# Patient Record
Sex: Female | Born: 1985
Health system: Southern US, Community
[De-identification: ages and names within clinical notes are randomized; demographics above are authoritative.]

## PROBLEM LIST (undated history)

## (undated) DIAGNOSIS — R87629 Unspecified abnormal cytological findings in specimens from vagina: Secondary | ICD-10-CM

## (undated) DIAGNOSIS — I10 Essential (primary) hypertension: Secondary | ICD-10-CM

## (undated) DIAGNOSIS — I82409 Acute embolism and thrombosis of unspecified deep veins of unspecified lower extremity: Secondary | ICD-10-CM

## (undated) DIAGNOSIS — A64 Unspecified sexually transmitted disease: Secondary | ICD-10-CM

## (undated) DIAGNOSIS — E039 Hypothyroidism, unspecified: Secondary | ICD-10-CM

## (undated) DIAGNOSIS — O24419 Gestational diabetes mellitus in pregnancy, unspecified control: Secondary | ICD-10-CM

## (undated) DIAGNOSIS — E119 Type 2 diabetes mellitus without complications: Secondary | ICD-10-CM

## (undated) DIAGNOSIS — Z8619 Personal history of other infectious and parasitic diseases: Secondary | ICD-10-CM

## (undated) HISTORY — DX: Essential (primary) hypertension: I10

## (undated) HISTORY — PX: EXPLORATORY LAPAROTOMY: SUR591

## (undated) HISTORY — PX: WISDOM TOOTH EXTRACTION: SHX21

## (undated) HISTORY — DX: Gestational diabetes mellitus in pregnancy, unspecified control: O24.419

## (undated) HISTORY — DX: Personal history of other infectious and parasitic diseases: Z86.19

## (undated) HISTORY — DX: Unspecified abnormal cytological findings in specimens from vagina: R87.629

## (undated) HISTORY — DX: Hypothyroidism, unspecified: E03.9

## (undated) HISTORY — DX: Unspecified sexually transmitted disease: A64

## (undated) HISTORY — DX: Acute embolism and thrombosis of unspecified deep veins of unspecified lower extremity: I82.409

## (undated) HISTORY — PX: OVARIAN CYST SURGERY: SHX726

## (undated) HISTORY — DX: Type 2 diabetes mellitus without complications: E11.9

---

## 2011-06-21 ENCOUNTER — Ambulatory Visit (INDEPENDENT_AMBULATORY_CARE_PROVIDER_SITE_OTHER): Payer: 59 | Admitting: Gynecology

## 2011-06-21 DIAGNOSIS — Z23 Encounter for immunization: Secondary | ICD-10-CM

## 2011-06-21 DIAGNOSIS — Z3049 Encounter for surveillance of other contraceptives: Secondary | ICD-10-CM

## 2011-06-27 ENCOUNTER — Encounter: Payer: Self-pay | Admitting: Anesthesiology

## 2011-07-05 ENCOUNTER — Ambulatory Visit (INDEPENDENT_AMBULATORY_CARE_PROVIDER_SITE_OTHER): Payer: 59 | Admitting: Gynecology

## 2011-07-05 DIAGNOSIS — Z3049 Encounter for surveillance of other contraceptives: Secondary | ICD-10-CM

## 2011-07-05 DIAGNOSIS — Z3046 Encounter for surveillance of implantable subdermal contraceptive: Secondary | ICD-10-CM

## 2011-09-06 ENCOUNTER — Ambulatory Visit (INDEPENDENT_AMBULATORY_CARE_PROVIDER_SITE_OTHER): Payer: 59 | Admitting: Gynecology

## 2011-09-06 ENCOUNTER — Other Ambulatory Visit (HOSPITAL_COMMUNITY)
Admission: RE | Admit: 2011-09-06 | Discharge: 2011-09-06 | Disposition: A | Discharge status: Home / Self Care | Payer: 59 | Source: Ambulatory Visit | Attending: Gynecology | Admitting: Gynecology

## 2011-09-06 ENCOUNTER — Encounter: Payer: Self-pay | Admitting: Gynecology

## 2011-09-06 VITALS — BP 130/70 | Ht 61.75 in | Wt 222.0 lb

## 2011-09-06 DIAGNOSIS — L0390 Cellulitis, unspecified: Secondary | ICD-10-CM | POA: Insufficient documentation

## 2011-09-06 DIAGNOSIS — L0291 Cutaneous abscess, unspecified: Secondary | ICD-10-CM | POA: Insufficient documentation

## 2011-09-06 DIAGNOSIS — Z113 Encounter for screening for infections with a predominantly sexual mode of transmission: Secondary | ICD-10-CM

## 2011-09-06 DIAGNOSIS — Z01419 Encounter for gynecological examination (general) (routine) without abnormal findings: Secondary | ICD-10-CM | POA: Insufficient documentation

## 2011-09-06 DIAGNOSIS — A5400 Gonococcal infection of lower genitourinary tract, unspecified: Secondary | ICD-10-CM

## 2011-09-06 DIAGNOSIS — B373 Candidiasis of vulva and vagina: Secondary | ICD-10-CM

## 2011-09-06 DIAGNOSIS — A749 Chlamydial infection, unspecified: Secondary | ICD-10-CM

## 2011-09-06 DIAGNOSIS — B3731 Acute candidiasis of vulva and vagina: Secondary | ICD-10-CM

## 2011-09-06 DIAGNOSIS — A549 Gonococcal infection, unspecified: Secondary | ICD-10-CM

## 2011-09-06 DIAGNOSIS — I82409 Acute embolism and thrombosis of unspecified deep veins of unspecified lower extremity: Secondary | ICD-10-CM

## 2011-09-06 DIAGNOSIS — A7489 Other chlamydial diseases: Secondary | ICD-10-CM

## 2011-09-06 MED ORDER — FLUCONAZOLE 150 MG PO TABS: 150.0000 mg | ORAL_TABLET | Freq: Once | ORAL | Status: AC

## 2011-09-06 NOTE — Progress Notes (Signed)
Kathryn Buck 1986-05-08 [id number]   History:    25 y.o.  for annual exam with no complaints. Patient did want to have an STD screen she has had new partner in the past year. Review of her record and indicated in the past she's had Chlamydia and gonorrhea and treated accordingly. 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 She is currently on Depo-Provera injectable contraception 150 mg every 3 months her last injection was 07/05/2011. Her record and indicated she was weighing 2 24,000-22. She's been followed by her family doctor in [place] a Dr. Abigail Butts who has been treating her for hypertension and she saw her last week and had all her lab work drawn. She was told that her hemoglobin A1c was borderline and she's working on exercise and diet.  Past medical history,surgical history, family history and social history were all reviewed and documented in the EPIC chart.  ROS:  Was performed and pertinent positives and negatives are included in the history.  Exam: chaperone present BP 130/70  Ht 5' 1.75" (1.568 m)  Wt 222 lb (100.699 kg)  BMI 40.93 kg/m2  LMP 07/06/2011  Body mass index is 40.93 kg/(m^2).  General appearance : Well developed well nourished female. No acute distress HEENT: Neck supple, trachea midline, no carotid bruits, no thyroidmegaly Lungs: Clear to auscultation, no rhonchi or wheezes, or rib retractions  Heart: Regular rate and rhythm, no murmurs or gallops Breast:Examined in sitting and supine position were symmetrical in appearance, no palpable masses or tenderness,  no skin retraction, no nipple inversion, no nipple discharge, no skin discoloration, no axillary or supraclavicular lymphadenopathy Abdomen: no palpable masses or tenderness, no rebound or guarding Extremities: no edema or skin discoloration or tenderness  Pelvic:  Bartholin, Urethra, Skene Glands: Within normal limits             Vagina: No gross lesions or  discharge  Cervix: No gross lesions or discharge  Uterus  anteverted, normal size, shape and consistency, non-tender and mobile  Adnexa  Without masses or tenderness  Anus and perineum  normal   Rectovaginal  normal sphincter tone without palpated masses or tenderness             Hemoccult not done      Assessment/Plan:  25 y.o. female for annual exam unremarkable. STD screen was done: Wet prep demonstrated moniliasis prescription for Diflucan 150 mg was provided. GC and Chlamydia culture, HIV, RPR, hepatitis B surface antigen done result pending at time of this dictation. Pap smear done today. Patient was encouraged to continue her monthly self breast examination and to follow with her internist. [name] see her back in one year or when necessary.    Ok Edwards MD, 10:44 AM 09/06/2011

## 2011-09-11 ENCOUNTER — Encounter: Payer: 59 | Admitting: Gynecology

## 2011-09-26 ENCOUNTER — Ambulatory Visit (INDEPENDENT_AMBULATORY_CARE_PROVIDER_SITE_OTHER): Payer: 59

## 2011-09-26 DIAGNOSIS — Z23 Encounter for immunization: Secondary | ICD-10-CM

## 2011-09-26 DIAGNOSIS — Z309 Encounter for contraceptive management, unspecified: Secondary | ICD-10-CM

## 2011-09-26 MED ORDER — MEDROXYPROGESTERONE ACETATE 150 MG/ML IM SUSP
150.0000 mg | Freq: Once | INTRAMUSCULAR | Status: AC
  Administered 2011-09-26: 150 mg via INTRAMUSCULAR

## 2011-12-19 ENCOUNTER — Ambulatory Visit (INDEPENDENT_AMBULATORY_CARE_PROVIDER_SITE_OTHER): Payer: 59 | Admitting: Anesthesiology

## 2011-12-19 DIAGNOSIS — Z309 Encounter for contraceptive management, unspecified: Secondary | ICD-10-CM

## 2011-12-19 MED ORDER — MEDROXYPROGESTERONE ACETATE 150 MG/ML IM SUSP
150.0000 mg | Freq: Once | INTRAMUSCULAR | Status: AC
  Administered 2011-12-19: 150 mg via INTRAMUSCULAR

## 2012-03-12 ENCOUNTER — Ambulatory Visit (INDEPENDENT_AMBULATORY_CARE_PROVIDER_SITE_OTHER): Payer: 59 | Admitting: Anesthesiology

## 2012-03-12 DIAGNOSIS — Z23 Encounter for immunization: Secondary | ICD-10-CM

## 2012-03-12 DIAGNOSIS — Z309 Encounter for contraceptive management, unspecified: Secondary | ICD-10-CM

## 2012-03-12 MED ORDER — MEDROXYPROGESTERONE ACETATE 150 MG/ML IM SUSP
150.0000 mg | Freq: Once | INTRAMUSCULAR | Status: AC
  Administered 2012-03-12: 150 mg via INTRAMUSCULAR

## 2012-03-25 ENCOUNTER — Encounter (HOSPITAL_COMMUNITY): Payer: Self-pay | Admitting: *Deleted

## 2012-03-25 ENCOUNTER — Emergency Department (INDEPENDENT_AMBULATORY_CARE_PROVIDER_SITE_OTHER)
Admission: EM | Admit: 2012-03-25 | Discharge: 2012-03-25 | Disposition: A | Discharge status: Home / Self Care | Payer: 59 | Source: Home / Self Care | Attending: Family Medicine | Admitting: Family Medicine

## 2012-03-25 DIAGNOSIS — N390 Urinary tract infection, site not specified: Secondary | ICD-10-CM

## 2012-03-25 LAB — POCT URINALYSIS DIP (DEVICE)
Bilirubin Urine: NEGATIVE
Glucose, UA: 100 mg/dL — AB
Ketones, ur: NEGATIVE mg/dL

## 2012-03-25 MED ORDER — SULFAMETHOXAZOLE-TRIMETHOPRIM 800-160 MG PO TABS: 1.0000 | ORAL_TABLET | Freq: Two times a day (BID) | ORAL | Status: AC

## 2012-03-25 MED ORDER — FLUCONAZOLE 150 MG PO TABS: 150.0000 mg | ORAL_TABLET | Freq: Once | ORAL | Status: AC

## 2012-03-25 NOTE — Discharge Instructions (Signed)
Take antibiotics as directed. Fill the fluconazole rx only if symptoms develop. Return to care should your symptoms not improve, or worsen in any way.

## 2012-03-25 NOTE — ED Provider Notes (Signed)
History     CSN: [id number]  Arrival date & time 03/25/12  0845   First MD Initiated Contact with Patient 03/25/12 [phone]      Chief Complaint  Patient presents with  . Urinary Tract Infection    (Consider location/radiation/quality/duration/timing/severity/associated sxs/prior treatment) HPI Comments: Kathryn Buck resents for evaluation of urinary frequency, urgency, decreased output, and terminal dysuria. She reports onset of symptoms 2 weeks ago, but they mildly improved. She reports worsening of symptoms last night. She reports that this is consistent with previous episodes of urinary tract infections. She denies any fever, nausea, vomiting, or back pain. She denies any vaginal discharge or bleeding.  Patient is a 26 y.o. female presenting with dysuria. The history is provided by the patient.  Dysuria  This is a new problem. The problem occurs every urination. The problem has been gradually worsening. The quality of the pain is described as burning. The pain is mild. There has been no fever. Associated symptoms include frequency and urgency. Pertinent negatives include no chills, no sweats, no nausea, no vomiting, no discharge, no hematuria, no hesitancy and no flank pain.    Past Medical History  Diagnosis Date  . History of chlamydia   . History of gonorrhea   . Hypertension   . DVT (deep venous thrombosis)     ON Sheryle Spray    Past Surgical History  Procedure Date  . Wisdom tooth extraction     Family History  Problem Relation Age of Onset  . Diabetes Mother   . Heart disease Mother   . Hypertension Father   . Diabetes Sister     History  Substance Use Topics  . Smoking status: Never Smoker   . Smokeless tobacco: Never Used  . Alcohol Use: Yes     occasional    OB History    Grav Para Term Preterm Abortions TAB SAB Ect Mult Living   0 0              Review of Systems  Constitutional: Negative.  Negative for chills.  HENT: Negative.   Eyes: Negative.     Respiratory: Negative.   Cardiovascular: Negative.   Gastrointestinal: Negative.  Negative for nausea and vomiting.  Genitourinary: Positive for dysuria, urgency, frequency and decreased urine volume. Negative for hesitancy, hematuria, flank pain, vaginal bleeding, vaginal discharge and difficulty urinating.  Musculoskeletal: Negative.   Skin: Negative.   Neurological: Negative.     Allergies  Review of patient's allergies indicates no known allergies.  Home Medications   Current Outpatient Rx  Name Route Sig Dispense Refill  . FLUCONAZOLE 150 MG PO TABS Oral Take 1 tablet (150 mg total) by mouth once. Take one pill once. May repeat if symptoms persist after 3rd day. 2 tablet 0  . MEDROXYPROGESTERONE ACETATE 150 MG/ML IM SUSP Intramuscular Inject 150 mg into the muscle every 3 (three) months.      . SULFAMETHOXAZOLE-TRIMETHOPRIM 800-160 MG PO TABS Oral Take 1 tablet by mouth 2 (two) times daily. 14 tablet 0    BP 133/87  Pulse 108  Temp(Src) 98 F (36.7 C) (Oral)  Resp 12  SpO2 97%  Physical Exam  Nursing note and vitals reviewed. Constitutional: She is oriented to person, place, and time. She appears well-developed and well-nourished.  HENT:  Head: Normocephalic and atraumatic.  Eyes: EOM are normal.  Neck: Normal range of motion.  Pulmonary/Chest: Effort normal.  Musculoskeletal: Normal range of motion.  Neurological: She is alert and oriented to person, place,  and time.  Skin: Skin is warm and dry.  Psychiatric: Her behavior is normal.    ED Course  Procedures (including critical care time)  Labs Reviewed  POCT URINALYSIS DIP (DEVICE) - Abnormal; Notable for the following:    Glucose, UA 100 (*)    Hgb urine dipstick TRACE (*)    Protein, ur 30 (*)    Leukocytes, UA TRACE (*) Biochemical Testing Only. Please order routine urinalysis from main lab if confirmatory testing is needed.   All other components within normal limits  POCT PREGNANCY, URINE   No  results found.   1. UTI (lower urinary tract infection)       MDM  Labs reviewed; given rx for TMP-SMX, fluconazole if needed        Renaee Munda, MD 03/25/12 [phone]

## 2012-03-25 NOTE — ED Notes (Signed)
Urinary frequency and some dysuria the past 2 weeks.  Denies fever at home.

## 2012-06-03 ENCOUNTER — Ambulatory Visit (INDEPENDENT_AMBULATORY_CARE_PROVIDER_SITE_OTHER): Payer: 59 | Admitting: Anesthesiology

## 2012-06-03 DIAGNOSIS — IMO0001 Reserved for inherently not codable concepts without codable children: Secondary | ICD-10-CM

## 2012-06-03 DIAGNOSIS — Z309 Encounter for contraceptive management, unspecified: Secondary | ICD-10-CM

## 2012-06-03 MED ORDER — MEDROXYPROGESTERONE ACETATE 150 MG/ML IM SUSP
150.0000 mg | Freq: Once | INTRAMUSCULAR | Status: AC
  Administered 2012-06-03: 150 mg via INTRAMUSCULAR

## 2012-08-19 ENCOUNTER — Other Ambulatory Visit: Payer: Self-pay | Admitting: *Deleted

## 2012-08-19 MED ORDER — MEDROXYPROGESTERONE ACETATE 150 MG/ML IM SUSP: 150.0000 mg | INTRAMUSCULAR | Status: DC

## 2012-08-19 NOTE — Telephone Encounter (Signed)
Pt called requesting refill on depo provera shot, annual scheduled on 09/15/12. rx sent.

## 2012-08-27 ENCOUNTER — Ambulatory Visit (INDEPENDENT_AMBULATORY_CARE_PROVIDER_SITE_OTHER): Payer: 59 | Admitting: *Deleted

## 2012-08-27 DIAGNOSIS — Z3049 Encounter for surveillance of other contraceptives: Secondary | ICD-10-CM

## 2012-08-27 MED ORDER — MEDROXYPROGESTERONE ACETATE 150 MG/ML IM SUSP
150.0000 mg | Freq: Once | INTRAMUSCULAR | Status: AC
  Administered 2012-08-27: 150 mg via INTRAMUSCULAR

## 2012-09-15 ENCOUNTER — Ambulatory Visit (INDEPENDENT_AMBULATORY_CARE_PROVIDER_SITE_OTHER): Payer: 59 | Admitting: Women's Health

## 2012-09-15 ENCOUNTER — Encounter: Payer: Self-pay | Admitting: Women's Health

## 2012-09-15 VITALS — BP 130/72 | Ht 62.0 in | Wt 224.0 lb

## 2012-09-15 DIAGNOSIS — Z309 Encounter for contraceptive management, unspecified: Secondary | ICD-10-CM

## 2012-09-15 DIAGNOSIS — IMO0001 Reserved for inherently not codable concepts without codable children: Secondary | ICD-10-CM

## 2012-09-15 DIAGNOSIS — E669 Obesity, unspecified: Secondary | ICD-10-CM

## 2012-09-15 DIAGNOSIS — Z113 Encounter for screening for infections with a predominantly sexual mode of transmission: Secondary | ICD-10-CM

## 2012-09-15 DIAGNOSIS — Z01419 Encounter for gynecological examination (general) (routine) without abnormal findings: Secondary | ICD-10-CM

## 2012-09-15 DIAGNOSIS — Z833 Family history of diabetes mellitus: Secondary | ICD-10-CM

## 2012-09-15 LAB — WET PREP FOR TRICH, YEAST, CLUE
Trich, Wet Prep: NONE SEEN
Yeast Wet Prep HPF POC: NONE SEEN

## 2012-09-15 MED ORDER — MEDROXYPROGESTERONE ACETATE 150 MG/ML IM SUSP: 150.0000 mg | INTRAMUSCULAR | Status: DC

## 2012-09-15 NOTE — Progress Notes (Signed)
Kathryn Buck 1986/03/26 [id number]    History:    The patient presents for annual exam.  Rare bleeding on Depo-Provera 150 every 12 weeks. History of a DVT while using Ortho Evra patch. History of positive gonorrhea/ Chlamydia. Completed gardasil 2012. Normal Pap 2012. Had Implanon in the past removed 2012. Had been treated with HCTZ for hypertension, primary care stopped medication, BP stable.   Past medical history, past surgical history, family history and social history were all reviewed and documented in the EPIC chart. 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   ROS:  A  ROS was performed and pertinent positives and negatives are included in the history.  Exam:  Filed Vitals:   09/15/12 1402  BP: 130/72    General appearance:  Normal Head/Neck:  Normal, without cervical or supraclavicular adenopathy. Thyroid:  Symmetrical, normal in size, without palpable masses or nodularity. Respiratory  Effort:  Normal  Auscultation:  Clear without wheezing or rhonchi Cardiovascular  Auscultation:  Regular rate, without rubs, murmurs or gallops  Edema/varicosities:  Not grossly evident Abdominal  Soft,nontender, without masses, guarding or rebound.  Liver/spleen:  No organomegaly noted  Hernia:  None appreciated  Skin  Inspection:  Grossly normal  Palpation:  Grossly normal Neurologic/psychiatric  Orientation:  Normal with appropriate conversation.  Mood/affect:  Normal  Genitourinary    Breasts: Examined lying and sitting.     Right: Without masses, retractions, discharge or axillary adenopathy.     Left: Without masses, retractions, discharge or axillary adenopathy.   Inguinal/mons:  Normal without inguinal adenopathy  External genitalia:  Normal  BUS/Urethra/Skene's glands:  Normal  Bladder:  Normal  Vagina:  Normal wet prep negative  Cervix:  Normal  Uterus: normal in size, shape and contour.  Midline and mobile  Adnexa/parametria:     Rt: Without masses or  tenderness.   Lt: Without masses or tenderness.  Anus and perineum: Normal  Digital rectal exam: Normal sphincter tone without palpated masses or tenderness  Assessment/Plan:  26 y.o. SBF G0 for annual exam with complaint of increased discharge at times.   Normal GYN exam on Depo-Provera Obesity STD screen History of hypertension  Plan: Depo-Provera 150 IM q. 12 weeks prescription, proper use given and reviewed. SBE's, increase exercise, decrease calories, calcium rich diet, MVI daily encouraged. CBC, glucose, UA, no Pap, history of normal Paps, new screening guidelines reviewed. GC/Chlamydia, HIV, hepatitis B., C., RPR. Condoms encouraged if becomes sexually active. Reviewed normality of discharge and wet prep negative. Aware of importance of blood pressure control, will return to primary care if blood pressure greater than 130/80, which she checks at work.   Harrington Challenger [facility], 2:53 PM 09/15/2012

## 2012-09-15 NOTE — Patient Instructions (Addendum)

## 2012-09-16 LAB — URINALYSIS W MICROSCOPIC + REFLEX CULTURE
Bilirubin Urine: NEGATIVE
Crystals: NONE SEEN
Glucose, UA: NEGATIVE mg/dL
Specific Gravity, Urine: >1.03 — ABNORMAL HIGH (ref 1.005–1.030)
Squamous Epithelial / LPF: NONE SEEN
Urobilinogen, UA: 1 mg/dL (ref 0.0–1.0)

## 2012-09-16 LAB — HEPATITIS B SURFACE ANTIGEN: Hepatitis B Surface Ag: NEGATIVE

## 2012-09-16 LAB — RPR

## 2012-09-16 LAB — HEPATITIS C ANTIBODY: HCV Ab: NEGATIVE

## 2012-09-16 LAB — CBC WITH DIFFERENTIAL/PLATELET
Basophils Absolute: 0 10*3/uL (ref 0.0–0.1)
Eosinophils Absolute: 0.4 10*3/uL (ref 0.0–0.7)
Eosinophils Relative: 4 % (ref 0–5)
HCT: 45.8 % (ref 36.0–46.0)
Lymphs Abs: 3.1 10*3/uL (ref 0.7–4.0)
MCH: 27.4 pg (ref 26.0–34.0)
MCV: 84.2 fL (ref 78.0–100.0)
Monocytes Absolute: 0.6 10*3/uL (ref 0.1–1.0)
Platelets: 380 10*3/uL (ref 150–400)
RDW: 13.6 % (ref 11.5–15.5)

## 2012-09-16 LAB — HIV ANTIBODY (ROUTINE TESTING W REFLEX): HIV: NONREACTIVE

## 2012-11-25 ENCOUNTER — Ambulatory Visit (INDEPENDENT_AMBULATORY_CARE_PROVIDER_SITE_OTHER): Payer: 59 | Admitting: *Deleted

## 2012-11-25 DIAGNOSIS — Z3049 Encounter for surveillance of other contraceptives: Secondary | ICD-10-CM

## 2012-11-25 MED ORDER — MEDROXYPROGESTERONE ACETATE 150 MG/ML IM SUSP
150.0000 mg | Freq: Once | INTRAMUSCULAR | Status: AC
  Administered 2012-11-25: 150 mg via INTRAMUSCULAR

## 2013-02-13 ENCOUNTER — Ambulatory Visit (INDEPENDENT_AMBULATORY_CARE_PROVIDER_SITE_OTHER): Payer: 59 | Admitting: *Deleted

## 2013-02-13 DIAGNOSIS — Z3049 Encounter for surveillance of other contraceptives: Secondary | ICD-10-CM

## 2013-02-13 MED ORDER — MEDROXYPROGESTERONE ACETATE 150 MG/ML IM SUSP
150.0000 mg | Freq: Once | INTRAMUSCULAR | Status: AC
  Administered 2013-02-13: 150 mg via INTRAMUSCULAR

## 2013-05-07 ENCOUNTER — Ambulatory Visit (INDEPENDENT_AMBULATORY_CARE_PROVIDER_SITE_OTHER): Payer: 59 | Admitting: Gynecology

## 2013-05-07 DIAGNOSIS — Z309 Encounter for contraceptive management, unspecified: Secondary | ICD-10-CM

## 2013-05-07 MED ORDER — MEDROXYPROGESTERONE ACETATE 150 MG/ML IM SUSP
150.0000 mg | Freq: Once | INTRAMUSCULAR | Status: AC
  Administered 2013-05-07: 150 mg via INTRAMUSCULAR

## 2013-06-06 ENCOUNTER — Emergency Department (HOSPITAL_BASED_OUTPATIENT_CLINIC_OR_DEPARTMENT_OTHER)
Admission: EM | Admit: 2013-06-06 | Discharge: 2013-06-07 | Disposition: A | Discharge status: Home / Self Care | Payer: 59 | Attending: Emergency Medicine | Admitting: Emergency Medicine

## 2013-06-06 ENCOUNTER — Encounter (HOSPITAL_BASED_OUTPATIENT_CLINIC_OR_DEPARTMENT_OTHER): Payer: Self-pay | Admitting: *Deleted

## 2013-06-06 DIAGNOSIS — M545 Low back pain, unspecified: Secondary | ICD-10-CM | POA: Insufficient documentation

## 2013-06-06 DIAGNOSIS — Z86718 Personal history of other venous thrombosis and embolism: Secondary | ICD-10-CM | POA: Insufficient documentation

## 2013-06-06 DIAGNOSIS — D271 Benign neoplasm of left ovary: Secondary | ICD-10-CM

## 2013-06-06 DIAGNOSIS — Z3202 Encounter for pregnancy test, result negative: Secondary | ICD-10-CM | POA: Insufficient documentation

## 2013-06-06 DIAGNOSIS — R110 Nausea: Secondary | ICD-10-CM | POA: Insufficient documentation

## 2013-06-06 DIAGNOSIS — I10 Essential (primary) hypertension: Secondary | ICD-10-CM | POA: Insufficient documentation

## 2013-06-06 DIAGNOSIS — D279 Benign neoplasm of unspecified ovary: Secondary | ICD-10-CM | POA: Insufficient documentation

## 2013-06-06 DIAGNOSIS — Z8619 Personal history of other infectious and parasitic diseases: Secondary | ICD-10-CM | POA: Insufficient documentation

## 2013-06-06 LAB — URINALYSIS, ROUTINE W REFLEX MICROSCOPIC
Bilirubin Urine: NEGATIVE
Glucose, UA: NEGATIVE mg/dL
Hgb urine dipstick: NEGATIVE
Ketones, ur: 15 mg/dL — AB
Protein, ur: NEGATIVE mg/dL
pH: 6 (ref 5.0–8.0)

## 2013-06-06 LAB — URINE MICROSCOPIC-ADD ON

## 2013-06-06 NOTE — ED Notes (Signed)
Pt c/o left flank pain since yesterday. "Feels better after I pee." No hx of stone. +nausea. Denies other s/s.

## 2013-06-07 ENCOUNTER — Emergency Department (HOSPITAL_BASED_OUTPATIENT_CLINIC_OR_DEPARTMENT_OTHER): Payer: 59

## 2013-06-07 MED ORDER — HYDROCODONE-ACETAMINOPHEN 5-325 MG PO TABS: 1.0000 | ORAL_TABLET | Freq: Four times a day (QID) | ORAL | Status: DC | PRN

## 2013-06-07 MED ORDER — NITROFURANTOIN MONOHYD MACRO 100 MG PO CAPS
100.0000 mg | ORAL_CAPSULE | Freq: Once | ORAL | Status: AC
  Administered 2013-06-07: 100 mg via ORAL
  Filled 2013-06-07: qty 1

## 2013-06-07 MED ORDER — NITROFURANTOIN MONOHYD MACRO 100 MG PO CAPS: 100.0000 mg | ORAL_CAPSULE | Freq: Two times a day (BID) | ORAL | Status: DC

## 2013-06-07 NOTE — ED Notes (Addendum)
rx x 2 for macrobid and hydrocodone

## 2013-06-07 NOTE — ED Provider Notes (Signed)
History    This chart was scribed for Kathryn Seamen, MD by Quintella Reichert, ED scribe.  This patient was seen in room MH07/MH07 and the patient's care was started at 12:04 AM.   CSN: [id number]  Arrival date & time 06/06/13  2315      Chief Complaint  Patient presents with  . Flank Pain     The history is provided by the patient. No language interpreter was used.    HPI Comments: Kathryn Buck is a 27 y.o. female with h/o recurrent flank pain who presents to the Emergency Department complaining of intermittent left flank pain that began yesterday.  Pain is characterized as dull and localized to the left lower back and LLQ of her abdomen.  Patient notes that pain is brought on by certain positions and is also exacerbated after urinating, though she denies increased pain during urination.  She also experiences nausea when pain is very bad.  She denies dysuria, hematuria, fever, or chills.  She admits to history of similar episodes that occur every other month and last 1-2 days before resolving on their own.  She has not sought treatment for these symptoms.  She denies h/o kidney stones to her knowledge..   Past Medical History  Diagnosis Date  . History of chlamydia   . History of gonorrhea   . Hypertension   . DVT (deep venous thrombosis)     ON Sheryle Spray    Past Surgical History  Procedure Laterality Date  . Wisdom tooth extraction      Family History  Problem Relation Age of Onset  . Diabetes Mother   . Heart disease Mother   . Hypertension Mother   . Hypertension Father   . Diabetes Sister     History  Substance Use Topics  . Smoking status: Never Smoker   . Smokeless tobacco: Never Used  . Alcohol Use: Yes     Comment: occasional    OB History   Grav Para Term Preterm Abortions TAB SAB Ect Mult Living   0 0              Review of Systems A complete 10 system review of systems was obtained and all systems are negative except as noted in the HPI and  PMH.    Allergies  Review of patient's allergies indicates no known allergies.  Home Medications   Current Outpatient Rx  Name  Route  Sig  Dispense  Refill  . medroxyPROGESTERone (DEPO-PROVERA) 150 MG/ML injection   Intramuscular   Inject 1 mL (150 mg total) into the muscle every 3 (three) months.   1 mL   4     BP 152/104  Pulse 104  Temp(Src) 98.7 F (37.1 C) (Oral)  Resp 20  Ht 5\' 2"  (1.575 m)  Wt 220 lb (99.791 kg)  BMI 40.23 kg/m2  SpO2 100%  Physical Exam  Nursing note and vitals reviewed. General: Well-developed, well-nourished female in no acute distress; appearance consistent with age of record HENT: normocephalic, atraumatic Eyes: pupils equal round and reactive to light; extraocular muscles intact Neck: supple Heart: regular rate and rhythm; no murmurs, rubs or gallops Lungs: clear to auscultation bilaterally Abdomen: soft; nondistended; nontender; no masses or hepatosplenomegaly; bowel sounds present GU: No CVA tenderness Extremities: No deformity; full range of motion; pulses normal Neurologic: Awake, alert and oriented; motor function intact in all extremities and symmetric; no facial droop Skin: Warm and dry Psychiatric: Normal mood and affect   ED  Course  Procedures (including critical care time)  DIAGNOSTIC STUDIES: Oxygen Saturation is 100% on room air, normal by my interpretation.    COORDINATION OF CARE: 12:06 AM-Discussed treatment plan which includes CT-scan to rule out kidney stone with pt at bedside and pt agreed to plan.     MDM   Nursing notes and vitals signs, including pulse oximetry, reviewed.  Summary of this visit's results, reviewed by myself:  Labs:  Results for orders placed during the hospital encounter of 06/06/13 (from the past 24 hour(s))  URINALYSIS, ROUTINE W REFLEX MICROSCOPIC     Status: Abnormal   Collection Time    06/06/13 11:29 PM      Result Value Range   Color, Urine YELLOW  YELLOW   APPearance CLEAR   CLEAR   Specific Gravity, Urine 1.030  1.005 - 1.030   pH 6.0  5.0 - 8.0   Glucose, UA NEGATIVE  NEGATIVE mg/dL   Hgb urine dipstick NEGATIVE  NEGATIVE   Bilirubin Urine NEGATIVE  NEGATIVE   Ketones, ur 15 (*) NEGATIVE mg/dL   Protein, ur NEGATIVE  NEGATIVE mg/dL   Urobilinogen, UA 0.2  0.0 - 1.0 mg/dL   Nitrite NEGATIVE  NEGATIVE   Leukocytes, UA MODERATE (*) NEGATIVE  PREGNANCY, URINE     Status: None   Collection Time    06/06/13 11:29 PM      Result Value Range   Preg Test, Ur NEGATIVE  NEGATIVE  URINE MICROSCOPIC-ADD ON     Status: Abnormal   Collection Time    06/06/13 11:29 PM      Result Value Range   Squamous Epithelial / LPF FEW (*) RARE   WBC, UA 11-20  <3 WBC/hpf   RBC / HPF 0-2  <3 RBC/hpf   Bacteria, UA MANY (*) RARE    Imaging Studies: Ct Abdomen Pelvis Wo Contrast  06/07/2013   *RADIOLOGY REPORT*  Clinical Data: Left flank pain and nausea for 1 day, history hypertension  CT ABDOMEN AND PELVIS WITHOUT CONTRAST  Technique:  Multidetector CT imaging of the abdomen and pelvis was performed following the standard protocol without intravenous contrast. Sagittal and coronal MPR images reconstructed from axial data set.  Comparison: None  Findings: 4 mm nonspecific left lower lobe nodule image 7. No urinary tract calcification, hydronephrosis or ureteral dilatation. Within limits of a nonenhanced exam no focal abnormalities of the liver, spleen, pancreas, kidneys, or adrenal glands. Normal-appearing decompressed bladder, ureters, uterus, and right ovary. Mass identified arising from left ovary, 8.4 x 7.1 x 9.2 cm in size, containing fat, soft tissue, and calcific components/teeth, consistent with a large dermoid tumor. Stomach and bowel loops normal appearance. No additional mass, adenopathy, free fluid, hernia, or bony lesion.  IMPRESSION: No urinary tract calcification or obstruction. 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   Original Report Authenticated  By: Ulyses Southward, M.D.   12:44 AM Patient advised of CT findings showing likely dermoid cyst. She is a patient [facility] and will call the office Monday for followup.       I personally performed the services described in this documentation, which was scribed in my presence.  The recorded information has been reviewed and is accurate.    Kathryn Seamen, MD 06/07/13 [phone]

## 2013-06-08 LAB — URINE CULTURE

## 2013-06-09 ENCOUNTER — Ambulatory Visit (INDEPENDENT_AMBULATORY_CARE_PROVIDER_SITE_OTHER): Payer: 59

## 2013-06-09 ENCOUNTER — Other Ambulatory Visit: Payer: Self-pay | Admitting: Gynecology

## 2013-06-09 ENCOUNTER — Telehealth: Payer: Self-pay

## 2013-06-09 ENCOUNTER — Encounter: Payer: Self-pay | Admitting: Gynecology

## 2013-06-09 ENCOUNTER — Ambulatory Visit (INDEPENDENT_AMBULATORY_CARE_PROVIDER_SITE_OTHER): Payer: 59 | Admitting: Gynecology

## 2013-06-09 VITALS — BP 130/88

## 2013-06-09 DIAGNOSIS — R1904 Left lower quadrant abdominal swelling, mass and lump: Secondary | ICD-10-CM

## 2013-06-09 DIAGNOSIS — R1900 Intra-abdominal and pelvic swelling, mass and lump, unspecified site: Secondary | ICD-10-CM

## 2013-06-09 DIAGNOSIS — R102 Pelvic and perineal pain: Secondary | ICD-10-CM

## 2013-06-09 DIAGNOSIS — N839 Noninflammatory disorder of ovary, fallopian tube and broad ligament, unspecified: Secondary | ICD-10-CM

## 2013-06-09 DIAGNOSIS — N838 Other noninflammatory disorders of ovary, fallopian tube and broad ligament: Secondary | ICD-10-CM

## 2013-06-09 DIAGNOSIS — E282 Polycystic ovarian syndrome: Secondary | ICD-10-CM

## 2013-06-09 DIAGNOSIS — N949 Unspecified condition associated with female genital organs and menstrual cycle: Secondary | ICD-10-CM

## 2013-06-09 NOTE — Progress Notes (Signed)
Kathryn Buck is an 27 y.o. female. who presented to the emergency room this past weekend complaining of left lower abdominal discomfort and left flank pain. Patient stated that the pain was dull and localized. Patient notes that pain is brought on by certain positions and is also exacerbated after urinating, though she denies increased pain during urination. She also experiences nausea when pain is very bad. She denies dysuria, hematuria, fever, or chills. She admits to history of similar episodes that occur every other month and last 1-2 days before resolving on their own. She has not sought treatment for these symptoms. She denies h/o kidney stones to her knowledge.  Patient had a CT ordered in the emergency room this weekend with the following results:  Normal-appearing decompressed bladder, ureters, uterus, and right  ovary.  Mass identified arising from left ovary, 8.4 x 7.1 x 9.2 cm in  size, containing fat, soft tissue, and calcific components/teeth,  consistent with a large dermoid tumor.  Stomach and bowel loops normal appearance.  No additional mass, adenopathy, free fluid, hernia, or bony lesion.   Ultrasound today: Uterus measured 8.5 x 5.7 x 4.1 cm with endometrial stripe of 4.8 mm. Right ovary with numerous follicles in the perimeter consistent with polycystic ovarian syndrome. Left ovarian tissue not seen. Left adnexal solid cystic mass with calcifications measuring 9.2 x 10.0 x 6.5 cm with arterial blood flow seen the wall of the mass. There was no fluid in the cul-de-sac.   Pertinent Gynecological History: Menses: No menses on Depo-Provera injection Bleeding: none Contraception: Depo-Provera injections DES exposure: denies Blood transfusions: none Sexually transmitted diseases: gonorrhea and chlamydia in the past Previous GYN Procedures: Anticoagulated after developing DVT on Ortho Evra contraceptive patch  Last mammogram: None indicated Date: none indicated Last pap: normal  Date: 2012 OB History: G0, P0   Menstrual History: Menarche age: 4  No LMP recorded. Patient has had an injection.    Past Medical History  Diagnosis Date  . History of chlamydia   . History of gonorrhea   . Hypertension   . DVT (deep venous thrombosis)     ON Sheryle Spray    Past Surgical History  Procedure Laterality Date  . Wisdom tooth extraction      Family History  Problem Relation Age of Onset  . Diabetes Mother   . Heart disease Mother   . Hypertension Mother   . Hypertension Father   . Diabetes Sister     Social History:  reports that she has never smoked. She has never used smokeless tobacco. She reports that  drinks alcohol. She reports that she does not use illicit drugs.  Allergies: No Known Allergies   (Not in a hospital admission)  REVIEW OF SYSTEMS: A ROS was performed and pertinent positives and negatives are included in the history.  GENERAL: No fevers or chills. HEENT: No change in vision, no earache, sore throat or sinus congestion. NECK: No pain or stiffness. CARDIOVASCULAR: No chest pain or pressure. No palpitations. PULMONARY: No shortness of breath, cough or wheeze. GASTROINTESTINAL: No abdominal pain, nausea, vomiting or diarrhea, melena or bright red blood per rectum. GENITOURINARY: No urinary frequency, urgency, hesitancy or dysuria. MUSCULOSKELETAL: No joint or muscle pain, no back pain, no recent trauma. DERMATOLOGIC: No rash, no itching, no lesions. ENDOCRINE: No polyuria, polydipsia, no heat or cold intolerance. No recent change in weight. HEMATOLOGICAL: No anemia or easy bruising or bleeding. NEUROLOGIC: No headache, seizures, numbness, tingling or weakness. PSYCHIATRIC: No depression, no loss  of interest in normal activity or change in sleep pattern.     Blood pressure 130/88.  Physical Exam:  HEENT:unremarkable Neck:Supple, midline, no thyroid megaly, no carotid bruits Lungs:  Clear to auscultation no rhonchi's or  wheezes Heart:Regular rate and rhythm, no murmurs or gallops Breast Exam:not examined Abdomen:pendulous slightly tender left lower quadrant Pelvic: Within normal limits Vagina:no lesions or discharge Cervix:no lesions or discharge Uterus:anteverted nontender Adnexa:fullness left adnexa Extremities: No cords, no edema Rectal:not examined   Assessment/Plan: 27 year old with apparent large left dermoid cyst. Patient will be scheduled for laparotomy with possible left ovarian cystectomy, possible left oophorectomy, possible left salpingo-oophorectomy. Because of patient's past history of a DVT she will be placed on Lovenox 40 mg subcutaneous 2 hours prior to surgery and will continue to do so for 4 weeks postop. Additional risks discussed with the patient were as follows:                        Patient was counseled as to the risk of surgery to include the following:  1. Infection (prohylactic antibiotics will be administered)  2. DVT/Pulmonary Embolism (prophylactic pneumo compression stockings will be used)  3.Trauma to internal organs requiring additional surgical procedure to repair any injury to     Internal organs requiring perhaps additional hospitalization days.  4.Hemmorhage requiring transfusion and blood products which carry risks such as  anaphylactic reaction, hepatitis and AIDS  Patient had received literature information on the procedure scheduled and all her questions were answered and fully accepts all risk.  [facility] HMD10:18 AMTD@  Reynaldo Minium H 06/09/2013, 10:11 AM

## 2013-06-09 NOTE — Telephone Encounter (Signed)
I called patient to schedule Expl Laparotomy per Dr Glenetta Hew surgery request.  We discussed insurance benefits and upfront costs, even discussing, patient only paying half of her upfront and making payments on the rest.  We discussed estimated hospital charges and patient's estimated financial responsibility so patient got a good idea about the costs, etc.  Patient was overwhelmed regarding the costs of surgery both hospital and physician charges.  She is not sure this is something she wants to proceed with at this time.  She said she is about to move into a new position at work in the next few weeks as well and doesn't think timing is good.   She wants time to consider all of this. She was provided my direct phone number and said she will be in touch at some point.

## 2013-07-01 ENCOUNTER — Other Ambulatory Visit: Payer: Self-pay | Admitting: Gynecology

## 2013-07-01 MED ORDER — ENOXAPARIN SODIUM 40 MG/0.4ML ~~LOC~~ SOLN: SUBCUTANEOUS | Status: DC

## 2013-07-30 ENCOUNTER — Encounter (HOSPITAL_COMMUNITY): Payer: Self-pay | Admitting: Pharmacy Technician

## 2013-07-31 ENCOUNTER — Ambulatory Visit (INDEPENDENT_AMBULATORY_CARE_PROVIDER_SITE_OTHER): Payer: 59 | Admitting: *Deleted

## 2013-07-31 DIAGNOSIS — Z3049 Encounter for surveillance of other contraceptives: Secondary | ICD-10-CM

## 2013-07-31 MED ORDER — MEDROXYPROGESTERONE ACETATE 150 MG/ML IM SUSP
150.0000 mg | Freq: Once | INTRAMUSCULAR | Status: AC
  Administered 2013-07-31: 150 mg via INTRAMUSCULAR

## 2013-08-06 ENCOUNTER — Telehealth: Payer: Self-pay

## 2013-08-06 ENCOUNTER — Encounter (HOSPITAL_COMMUNITY)
Admission: RE | Admit: 2013-08-06 | Discharge: 2013-08-06 | Disposition: A | Discharge status: Home / Self Care | Payer: 59 | Source: Ambulatory Visit | Attending: Gynecology | Admitting: Gynecology

## 2013-08-06 ENCOUNTER — Encounter (HOSPITAL_COMMUNITY): Payer: Self-pay

## 2013-08-06 DIAGNOSIS — Z01818 Encounter for other preprocedural examination: Secondary | ICD-10-CM | POA: Insufficient documentation

## 2013-08-06 DIAGNOSIS — Z01812 Encounter for preprocedural laboratory examination: Secondary | ICD-10-CM | POA: Insufficient documentation

## 2013-08-06 LAB — CBC
HCT: 42.6 % (ref 36.0–46.0)
MCV: 82.4 fL (ref 78.0–100.0)
Platelets: 373 10*3/uL (ref 150–400)
RBC: 5.17 MIL/uL — ABNORMAL HIGH (ref 3.87–5.11)
RDW: 13.2 % (ref 11.5–15.5)
WBC: 11.1 10*3/uL — ABNORMAL HIGH (ref 4.0–10.5)

## 2013-08-06 LAB — URINALYSIS, ROUTINE W REFLEX MICROSCOPIC
Hgb urine dipstick: NEGATIVE
Nitrite: NEGATIVE
Specific Gravity, Urine: >1.03 — ABNORMAL HIGH (ref 1.005–1.030)
Urobilinogen, UA: 0.2 mg/dL (ref 0.0–1.0)
pH: 6 (ref 5.0–8.0)

## 2013-08-06 NOTE — Telephone Encounter (Signed)
Kathryn Buck [phone] ext [phone]) called to let me know that prior authorization is complete and authorization is good for 2 days. [facility] will need to update on 08/12/13.  Chartered loss adjuster. #[id number].

## 2013-08-06 NOTE — Patient Instructions (Signed)
Your procedure is scheduled on:08/10/13  Enter through the Main Entrance at :6am Pick up desk phone and dial [phone] and inform us of your arrival.  Please call [phone] if you have any problems the morning of surgery.  Remember: Do not eat food or drink liquids, including water, after midnight:SUNDAY   You may brush your teeth the morning of surgery.   DO NOT wear jewelry, eye make-up, lipstick,body lotion, or dark fingernail polish.  (Polished toes are ok) You may wear deodorant.  If you are to be admitted after surgery, leave suitcase in car until your room has been assigned. Patients discharged on the day of surgery will not be allowed to drive home. Wear loose fitting, comfortable clothes for your ride home.

## 2013-08-07 NOTE — H&P (Signed)
Kathryn Buck is an 27 y.o. female. Who was seen in the emergency room presented to the emergency room in June of this year complaining of left lower abdominal discomfort and left flank pain. Patient stated that the pain was dull and localized. Patient notes that pain is brought on by certain positions and is also exacerbated after urinating, though she denies increased pain during urination. She also experiences nausea when pain is very bad. She denies dysuria, hematuria, fever, or chills. She admits to history of similar episodes that occur every other month and last 1-2 days before resolving on their own. She has not sought treatment for these symptoms. She denies h/o kidney stones to her knowledge.   Patient had a CT ordered in the emergency room this weekend with the following results:  Normal-appearing decompressed bladder, ureters, uterus, and right  ovary.  Mass identified arising from left ovary, 8.4 x 7.1 x 9.2 cm in  size, containing fat, soft tissue, and calcific components/teeth,  consistent with a large dermoid tumor.  Stomach and bowel loops normal appearance.  No additional mass, adenopathy, free fluid, hernia, or bony lesion.   Ultrasound today: Uterus measured 8.5 x 5.7 x 4.1 cm with endometrial stripe of 4.8 mm. Right ovary with numerous follicles in the perimeter consistent with polycystic ovarian syndrome. Left ovarian tissue not seen. Left adnexal solid cystic mass with calcifications measuring 9.2 x 10.0 x 6.5 cm with arterial blood flow seen the wall of the mass. There was no fluid in the cul-de-sac.   Pertinent Gynecological History:  Menses: No menses on Depo-Provera injection  Bleeding: none  Contraception: Depo-Provera injections  DES exposure: denies  Blood transfusions: none  Sexually transmitted diseases: gonorrhea and chlamydia in the past  Previous GYN Procedures: Anticoagulated after developing DVT on Ortho Evra contraceptive patch  Last mammogram: None indicated  Date: none indicated  Last pap: normal Date: 2012  OB History: G0, P0   Menstrual History:  Menarche age: 60  No LMP recorded. Patient has had an injection.  Past Medical History   Diagnosis  Date   .  History of chlamydia    .  History of gonorrhea    .  Hypertension    .  DVT (deep venous thrombosis)      ON Sheryle Spray    Past Surgical History   Procedure  Laterality  Date   .  Wisdom tooth extraction      Family History   Problem  Relation  Age of Onset   .  Diabetes  Mother    .  Heart disease  Mother    .  Hypertension  Mother    .  Hypertension  Father    .  Diabetes  Sister    Social History: reports that she has never smoked. She has never used smokeless tobacco. She reports that drinks alcohol. She reports that she does not use illicit drugs.  Allergies: No Known Allergies  (Not in a hospital admission)   REVIEW OF SYSTEMS: A ROS was performed and pertinent positives and negatives are included in the history.  GENERAL: No fevers or chills. HEENT: No change in vision, no earache, sore throat or sinus congestion. NECK: No pain or stiffness. CARDIOVASCULAR: No chest pain or pressure. No palpitations. PULMONARY: No shortness of breath, cough or wheeze. GASTROINTESTINAL: No abdominal pain, nausea, vomiting or diarrhea, melena or bright red blood per rectum. GENITOURINARY: No urinary frequency, urgency, hesitancy or dysuria. MUSCULOSKELETAL: No joint or muscle pain,  no back pain, no recent trauma. DERMATOLOGIC: No rash, no itching, no lesions. ENDOCRINE: No polyuria, polydipsia, no heat or cold intolerance. No recent change in weight. HEMATOLOGICAL: No anemia or easy bruising or bleeding. NEUROLOGIC: No headache, seizures, numbness, tingling or weakness. PSYCHIATRIC: No depression, no loss of interest in normal activity or change in sleep pattern.  Blood pressure 130/88.  Physical Exam:  HEENT:unremarkable  Neck:Supple, midline, no thyroid megaly, no carotid bruits  Lungs:  Clear to auscultation no rhonchi's or wheezes  Heart:Regular rate and rhythm, no murmurs or gallops  Breast Exam:not examined  Abdomen:pendulous slightly tender left lower quadrant  Pelvic: Within normal limits  Vagina:no lesions or discharge  Cervix:no lesions or discharge  Uterus:anteverted nontender  Adnexa:fullness left adnexa  Extremities: No cords, no edema  Rectal:not examined   Assessment/Plan:  27 year old with apparent large left dermoid cyst. Patient will be scheduled for laparotomy with possible left ovarian cystectomy, possible left oophorectomy, possible left salpingo-oophorectomy. Because of patient's past history of a DVT she will be placed on Lovenox 40 mg subcutaneous 2 hours prior to surgery and will continue to do so for 4 weeks postop. Additional risks discussed with the patient were as follows:  Patient was counseled as to the risk of surgery to include the following:  1. Infection (prohylactic antibiotics will be administered)  2. DVT/Pulmonary Embolism (prophylactic pneumo compression stockings will be used)  3.Trauma to internal organs requiring additional surgical procedure to repair any injury to  Internal organs requiring perhaps additional hospitalization days.  4.Hemmorhage requiring transfusion and blood products which carry risks such as anaphylactic reaction, hepatitis and AIDS  Patient had received literature information on the procedure scheduled and all her questions were answered and fully accepts all risk.

## 2013-08-09 MED ORDER — CEFOTETAN DISODIUM 2 G IJ SOLR
2.0000 g | INTRAMUSCULAR | Status: AC
  Administered 2013-08-10: 2 g via INTRAVENOUS
  Filled 2013-08-09: qty 2

## 2013-08-09 MED ORDER — ENOXAPARIN SODIUM 40 MG/0.4ML ~~LOC~~ SOLN
40.0000 mg | SUBCUTANEOUS | Status: AC
  Administered 2013-08-10: 40 mg via SUBCUTANEOUS
  Filled 2013-08-09: qty 0.4

## 2013-08-10 ENCOUNTER — Encounter (HOSPITAL_COMMUNITY): Payer: Self-pay | Admitting: Anesthesiology

## 2013-08-10 ENCOUNTER — Observation Stay (HOSPITAL_COMMUNITY)
Admission: RE | Admit: 2013-08-10 | Discharge: 2013-08-11 | Disposition: A | Discharge status: Home / Self Care | Payer: 59 | Source: Ambulatory Visit | Attending: Gynecology | Admitting: Gynecology

## 2013-08-10 ENCOUNTER — Ambulatory Visit (HOSPITAL_COMMUNITY): Payer: 59 | Admitting: Anesthesiology

## 2013-08-10 ENCOUNTER — Encounter (HOSPITAL_COMMUNITY)
Admission: RE | Disposition: A | Discharge status: Home / Self Care | Payer: Self-pay | Source: Ambulatory Visit | Attending: Gynecology

## 2013-08-10 ENCOUNTER — Encounter (HOSPITAL_COMMUNITY): Payer: Self-pay | Admitting: *Deleted

## 2013-08-10 DIAGNOSIS — D279 Benign neoplasm of unspecified ovary: Principal | ICD-10-CM | POA: Insufficient documentation

## 2013-08-10 DIAGNOSIS — I10 Essential (primary) hypertension: Secondary | ICD-10-CM | POA: Insufficient documentation

## 2013-08-10 DIAGNOSIS — Z9889 Other specified postprocedural states: Secondary | ICD-10-CM

## 2013-08-10 DIAGNOSIS — E282 Polycystic ovarian syndrome: Secondary | ICD-10-CM | POA: Insufficient documentation

## 2013-08-10 DIAGNOSIS — N9489 Other specified conditions associated with female genital organs and menstrual cycle: Secondary | ICD-10-CM

## 2013-08-10 DIAGNOSIS — Z86718 Personal history of other venous thrombosis and embolism: Secondary | ICD-10-CM | POA: Insufficient documentation

## 2013-08-10 HISTORY — PX: LAPAROTOMY: SHX154

## 2013-08-10 LAB — PREGNANCY, URINE: Preg Test, Ur: NEGATIVE

## 2013-08-10 LAB — CBC
HCT: 38.2 % (ref 36.0–46.0)
MCHC: 33.8 g/dL (ref 30.0–36.0)
MCV: 82 fL (ref 78.0–100.0)
RDW: 13.3 % (ref 11.5–15.5)

## 2013-08-10 LAB — CREATININE, SERUM: GFR calc Af Amer: >90 mL/min (ref >90)

## 2013-08-10 SURGERY — LAPAROTOMY, EXPLORATORY
Anesthesia: General | Site: Abdomen | Laterality: Left | Wound class: Clean Contaminated

## 2013-08-10 MED ORDER — ENOXAPARIN SODIUM 40 MG/0.4ML ~~LOC~~ SOLN
40.0000 mg | SUBCUTANEOUS | Status: DC
  Administered 2013-08-11: 40 mg via SUBCUTANEOUS
  Filled 2013-08-10 (×2): qty 0.4

## 2013-08-10 MED ORDER — DEXTROSE 5 % IV SOLN
2.0000 g | Freq: Two times a day (BID) | INTRAVENOUS | Status: AC
  Administered 2013-08-10 – 2013-08-11 (×2): 2 g via INTRAVENOUS
  Filled 2013-08-10 (×2): qty 2

## 2013-08-10 MED ORDER — ONDANSETRON HCL 4 MG/2ML IJ SOLN: 4.0000 mg | Freq: Four times a day (QID) | INTRAMUSCULAR | Status: DC | PRN

## 2013-08-10 MED ORDER — OXYCODONE-ACETAMINOPHEN 5-325 MG PO TABS
1.0000 | ORAL_TABLET | Freq: Four times a day (QID) | ORAL | Status: DC | PRN
  Administered 2013-08-11: 2 via ORAL
  Filled 2013-08-10: qty 2

## 2013-08-10 MED ORDER — DEXAMETHASONE SODIUM PHOSPHATE 10 MG/ML IJ SOLN
INTRAMUSCULAR | Status: AC
  Filled 2013-08-10: qty 1

## 2013-08-10 MED ORDER — FENTANYL CITRATE 0.05 MG/ML IJ SOLN
INTRAMUSCULAR | Status: DC | PRN
  Administered 2013-08-10: 50 ug via INTRAVENOUS
  Administered 2013-08-10 (×3): 100 ug via INTRAVENOUS

## 2013-08-10 MED ORDER — NEOSTIGMINE METHYLSULFATE 1 MG/ML IJ SOLN
INTRAMUSCULAR | Status: DC | PRN
  Administered 2013-08-10: 2.5 mg via INTRAVENOUS

## 2013-08-10 MED ORDER — LACTATED RINGERS IV SOLN
INTRAVENOUS | Status: DC
  Administered 2013-08-10 – 2013-08-11 (×3): via INTRAVENOUS

## 2013-08-10 MED ORDER — FENTANYL CITRATE 0.05 MG/ML IJ SOLN
INTRAMUSCULAR | Status: AC
  Filled 2013-08-10: qty 5

## 2013-08-10 MED ORDER — ONDANSETRON HCL 4 MG/2ML IJ SOLN
INTRAMUSCULAR | Status: AC
  Filled 2013-08-10: qty 2

## 2013-08-10 MED ORDER — HYDROMORPHONE HCL PF 1 MG/ML IJ SOLN
INTRAMUSCULAR | Status: DC | PRN
  Administered 2013-08-10: 1 mg via INTRAVENOUS

## 2013-08-10 MED ORDER — NEOSTIGMINE METHYLSULFATE 1 MG/ML IJ SOLN
INTRAMUSCULAR | Status: AC
  Filled 2013-08-10: qty 3

## 2013-08-10 MED ORDER — BUPIVACAINE HCL (PF) 0.25 % IJ SOLN
INTRAMUSCULAR | Status: DC | PRN
  Administered 2013-08-10: 10 mL

## 2013-08-10 MED ORDER — HYDROMORPHONE HCL PF 1 MG/ML IJ SOLN
0.2500 mg | INTRAMUSCULAR | Status: DC | PRN
  Administered 2013-08-10: 0.5 mg via INTRAVENOUS

## 2013-08-10 MED ORDER — DEXTROSE 5 % IV SOLN: 2.0000 g | Freq: Two times a day (BID) | INTRAVENOUS | Status: DC

## 2013-08-10 MED ORDER — NALOXONE HCL 0.4 MG/ML IJ SOLN: 0.4000 mg | INTRAMUSCULAR | Status: DC | PRN

## 2013-08-10 MED ORDER — DIPHENHYDRAMINE HCL 50 MG/ML IJ SOLN: 12.5000 mg | Freq: Four times a day (QID) | INTRAMUSCULAR | Status: DC | PRN

## 2013-08-10 MED ORDER — PROPOFOL 10 MG/ML IV EMUL
INTRAVENOUS | Status: AC
  Filled 2013-08-10: qty 40

## 2013-08-10 MED ORDER — HYDROMORPHONE HCL PF 1 MG/ML IJ SOLN
INTRAMUSCULAR | Status: AC
  Filled 2013-08-10: qty 1

## 2013-08-10 MED ORDER — DEXTROSE 5 % IV SOLN: 2.0000 g | Freq: Once | INTRAVENOUS | Status: DC

## 2013-08-10 MED ORDER — MIDAZOLAM HCL 2 MG/2ML IJ SOLN
INTRAMUSCULAR | Status: AC
  Filled 2013-08-10: qty 2

## 2013-08-10 MED ORDER — ROCURONIUM BROMIDE 50 MG/5ML IV SOLN
INTRAVENOUS | Status: AC
  Filled 2013-08-10: qty 1

## 2013-08-10 MED ORDER — BUPIVACAINE HCL (PF) 0.25 % IJ SOLN
INTRAMUSCULAR | Status: AC
  Filled 2013-08-10: qty 30

## 2013-08-10 MED ORDER — SODIUM CHLORIDE 0.9 % IJ SOLN: 9.0000 mL | INTRAMUSCULAR | Status: DC | PRN

## 2013-08-10 MED ORDER — DEXAMETHASONE SODIUM PHOSPHATE 10 MG/ML IJ SOLN
INTRAMUSCULAR | Status: DC | PRN
  Administered 2013-08-10: 10 mg via INTRAVENOUS

## 2013-08-10 MED ORDER — LIDOCAINE HCL (CARDIAC) 20 MG/ML IV SOLN
INTRAVENOUS | Status: DC | PRN
  Administered 2013-08-10: 80 mg via INTRAVENOUS

## 2013-08-10 MED ORDER — FENTANYL CITRATE 0.05 MG/ML IJ SOLN
INTRAMUSCULAR | Status: AC
  Filled 2013-08-10: qty 2

## 2013-08-10 MED ORDER — GLYCOPYRROLATE 0.2 MG/ML IJ SOLN
INTRAMUSCULAR | Status: DC | PRN
  Administered 2013-08-10: .5 mg via INTRAVENOUS
  Administered 2013-08-10: 0.1 mg via INTRAVENOUS

## 2013-08-10 MED ORDER — PSEUDOEPHEDRINE-GUAIFENESIN ER 60-600 MG PO TB12: 1.0000 | ORAL_TABLET | Freq: Two times a day (BID) | ORAL | Status: DC

## 2013-08-10 MED ORDER — PROMETHAZINE HCL 25 MG/ML IJ SOLN: 6.2500 mg | INTRAMUSCULAR | Status: DC | PRN

## 2013-08-10 MED ORDER — PROPOFOL 10 MG/ML IV BOLUS
INTRAVENOUS | Status: DC | PRN
  Administered 2013-08-10: 200 mg via INTRAVENOUS
  Administered 2013-08-10: 50 mg via INTRAVENOUS

## 2013-08-10 MED ORDER — HYDROMORPHONE HCL PF 1 MG/ML IJ SOLN
INTRAMUSCULAR | Status: AC
  Administered 2013-08-10: 0.5 mg via INTRAVENOUS
  Filled 2013-08-10: qty 1

## 2013-08-10 MED ORDER — LACTATED RINGERS IV SOLN
INTRAVENOUS | Status: DC
  Administered 2013-08-10 (×3): via INTRAVENOUS

## 2013-08-10 MED ORDER — DIPHENHYDRAMINE HCL 12.5 MG/5ML PO ELIX: 12.5000 mg | ORAL_SOLUTION | Freq: Four times a day (QID) | ORAL | Status: DC | PRN

## 2013-08-10 MED ORDER — ROCURONIUM BROMIDE 100 MG/10ML IV SOLN
INTRAVENOUS | Status: DC | PRN
  Administered 2013-08-10: 10 mg via INTRAVENOUS
  Administered 2013-08-10: 40 mg via INTRAVENOUS

## 2013-08-10 MED ORDER — LACTATED RINGERS IV SOLN: INTRAVENOUS | Status: DC

## 2013-08-10 MED ORDER — MEPERIDINE HCL 25 MG/ML IJ SOLN: 6.2500 mg | INTRAMUSCULAR | Status: DC | PRN

## 2013-08-10 MED ORDER — LIDOCAINE HCL (CARDIAC) 20 MG/ML IV SOLN
INTRAVENOUS | Status: AC
  Filled 2013-08-10: qty 5

## 2013-08-10 MED ORDER — MORPHINE SULFATE (PF) 1 MG/ML IV SOLN
INTRAVENOUS | Status: DC
  Administered 2013-08-10: 4 mg via INTRAVENOUS
  Administered 2013-08-10: 5.4 mg via INTRAVENOUS
  Administered 2013-08-10: 4 mg via INTRAVENOUS
  Administered 2013-08-10: 11:00:00 via INTRAVENOUS
  Administered 2013-08-11: 4 mg via INTRAVENOUS
  Administered 2013-08-11: 3 mg via INTRAVENOUS
  Administered 2013-08-11: 1 mg via INTRAVENOUS
  Filled 2013-08-10: qty 25

## 2013-08-10 MED ORDER — GLYCOPYRROLATE 0.2 MG/ML IJ SOLN
INTRAMUSCULAR | Status: AC
  Filled 2013-08-10: qty 4

## 2013-08-10 MED ORDER — ONDANSETRON HCL 4 MG/2ML IJ SOLN
INTRAMUSCULAR | Status: DC | PRN
  Administered 2013-08-10: 4 mg via INTRAVENOUS

## 2013-08-10 MED ORDER — MIDAZOLAM HCL 5 MG/5ML IJ SOLN
INTRAMUSCULAR | Status: DC | PRN
  Administered 2013-08-10: 2 mg via INTRAVENOUS

## 2013-08-10 SURGICAL SUPPLY — 41 items
CATH KIT ON Q 5IN DUAL SLV (PAIN MANAGEMENT) IMPLANT
CELLS DAT CNTRL 66122 CELL SVR (MISCELLANEOUS) IMPLANT
CLOTH BEACON ORANGE TIMEOUT ST (SAFETY) ×2 IMPLANT
DECANTER SPIKE VIAL GLASS SM (MISCELLANEOUS) IMPLANT
DRAIN JACKSON PRT FLT 7MM (DRAIN) ×2 IMPLANT
DRSG COVADERM 4X10 (GAUZE/BANDAGES/DRESSINGS) ×2 IMPLANT
DRSG XEROFORM 1X8 (GAUZE/BANDAGES/DRESSINGS) IMPLANT
EVACUATOR SILICONE 100CC (DRAIN) ×2 IMPLANT
GAUZE SPONGE 4X4 12PLY STRL LF (GAUZE/BANDAGES/DRESSINGS) IMPLANT
GAUZE SPONGE 4X4 16PLY XRAY LF (GAUZE/BANDAGES/DRESSINGS) ×2 IMPLANT
GLOVE BIOGEL PI IND STRL 8 (GLOVE) ×1 IMPLANT
GLOVE BIOGEL PI INDICATOR 8 (GLOVE) ×1
GLOVE ECLIPSE 7.5 STRL STRAW (GLOVE) ×4 IMPLANT
GOWN PREVENTION PLUS LG XLONG (DISPOSABLE) ×6 IMPLANT
NEEDLE HYPO 25X1 1.5 SAFETY (NEEDLE) ×2 IMPLANT
NS IRRIG 1000ML POUR BTL (IV SOLUTION) ×2 IMPLANT
PACK ABDOMINAL GYN (CUSTOM PROCEDURE TRAY) ×2 IMPLANT
PAD OB MATERNITY 4.3X12.25 (PERSONAL CARE ITEMS) ×2 IMPLANT
RETRACTOR WND ALEXIS 25 LRG (MISCELLANEOUS) IMPLANT
RTRCTR WOUND ALEXIS 18CM MED (MISCELLANEOUS)
RTRCTR WOUND ALEXIS 25CM LRG (MISCELLANEOUS)
SPONGE LAP 18X18 X RAY DECT (DISPOSABLE) ×4 IMPLANT
STAPLER VISISTAT 35W (STAPLE) ×2 IMPLANT
SUT CHROMIC 3 0 SH 27 (SUTURE) IMPLANT
SUT SILK 3 0 SH 30 (SUTURE) IMPLANT
SUT VIC AB 0 CT1 27 (SUTURE) ×2
SUT VIC AB 0 CT1 27XBRD ANBCTR (SUTURE) ×2 IMPLANT
SUT VIC AB 1 CT1 18XBRD ANBCTR (SUTURE) IMPLANT
SUT VIC AB 1 CT1 8-18 (SUTURE)
SUT VIC AB 3-0 CT1 27 (SUTURE) ×1
SUT VIC AB 3-0 CT1 TAPERPNT 27 (SUTURE) ×1 IMPLANT
SUT VIC AB 3-0 CTX 36 (SUTURE) ×2 IMPLANT
SUT VIC AB 3-0 SH 27 (SUTURE) ×2
SUT VIC AB 3-0 SH 27XBRD (SUTURE) ×2 IMPLANT
SUT VICRYL 0 TIES 12 18 (SUTURE) ×2 IMPLANT
SUT VICRYL 3 0 BR 18  UND (SUTURE) ×1
SUT VICRYL 3 0 BR 18 UND (SUTURE) ×1 IMPLANT
SYR CONTROL 10ML LL (SYRINGE) IMPLANT
TOWEL OR 17X24 6PK STRL BLUE (TOWEL DISPOSABLE) ×4 IMPLANT
TRAY FOLEY CATH 14FR (SET/KITS/TRAYS/PACK) ×2 IMPLANT
WATER STERILE IRR 1000ML POUR (IV SOLUTION) ×2 IMPLANT

## 2013-08-10 NOTE — Anesthesia Preprocedure Evaluation (Addendum)
Anesthesia Evaluation  Patient identified by MRN, date of birth, ID band Patient awake    Reviewed: Allergy & Precautions, H&P , NPO status , Patient's Chart, lab work & pertinent test results  Airway Mallampati: II TM Distance: >3 FB Neck ROM: Full    Dental no notable dental hx.    Pulmonary neg pulmonary ROS,  breath sounds clear to auscultation  Pulmonary exam normal       Cardiovascular hypertension, DVT Rhythm:Regular Rate:Normal     Neuro/Psych negative neurological ROS  negative psych ROS   GI/Hepatic negative GI ROS, Neg liver ROS,   Endo/Other  Morbid obesity  Renal/GU negative Renal ROS  negative genitourinary   Musculoskeletal negative musculoskeletal ROS (+)   Abdominal   Peds negative pediatric ROS (+)  Hematology negative hematology ROS (+)   Anesthesia Other Findings   Reproductive/Obstetrics negative OB ROS                          Anesthesia Physical Anesthesia Plan  ASA: III  Anesthesia Plan: General   Post-op Pain Management:    Induction: Intravenous  Airway Management Planned: Oral ETT  Additional Equipment:   Intra-op Plan:   Post-operative Plan: Extubation in OR  Informed Consent: I have reviewed the patients History and Physical, chart, labs and discussed the procedure including the risks, benefits and alternatives for the proposed anesthesia with the patient or authorized representative who has indicated his/her understanding and acceptance.   Dental advisory given  Plan Discussed with: CRNA  Anesthesia Plan Comments:         Anesthesia Quick Evaluation

## 2013-08-10 NOTE — Interval H&P Note (Signed)
History and Physical Interval Note:  08/10/2013 7:01 AM  Kathryn Buck  has presented today for surgery, with the diagnosis of left dermoid cyst, pelvic pain  The various methods of treatment have been discussed with the patient and family. After consideration of risks, benefits and other options for treatment, the patient has consented to  Procedure(s): EXPLORATORY LAPAROTOMY (Left) as a surgical intervention .  The patient's history has been reviewed, patient examined, no change in status, stable for surgery.  I have reviewed the patient's chart and labs.  Questions were answered to the patient's satisfaction.    Left ovarian cystectomy possible left oophorectomy, possible left salphingoophorectomy Ok Edwards

## 2013-08-10 NOTE — Anesthesia Postprocedure Evaluation (Signed)
  Anesthesia Post-op Note  Patient: Kathryn Buck  Procedure(s) Performed: Procedure(s): EXPLORATORY LAPAROTOMY; LEFT OVARIAN CYSTECTOMY (Left)  Patient Location: PACU and Women's Unit  Anesthesia Type:General  Level of Consciousness: awake, alert  and oriented  Airway and Oxygen Therapy: Patient Spontanous Breathing  Post-op Pain: mild  Post-op Assessment: Patient's Cardiovascular Status Stable, Respiratory Function Stable, Patent Airway, No signs of Nausea or vomiting and Pain level controlled  Post-op Vital Signs: stable  Complications: No apparent anesthesia complications

## 2013-08-10 NOTE — Anesthesia Procedure Notes (Signed)
Procedure Name: Intubation Date/Time: 08/10/2013 7:33 AM Performed by: Graciela Husbands Pre-anesthesia Checklist: Patient being monitored, Suction available, Emergency Drugs available, Patient identified and Timeout performed Patient Re-evaluated:Patient Re-evaluated prior to inductionOxygen Delivery Method: Circle system utilized Preoxygenation: Pre-oxygenation with 100% oxygen Intubation Type: IV induction Ventilation: Mask ventilation without difficulty Laryngoscope Size: Mac and 3 Grade View: Grade I Tube type: Oral Number of attempts: 1 Airway Equipment and Method: Patient positioned with wedge pillow and Stylet Placement Confirmation: ETT inserted through vocal cords under direct vision,  positive ETCO2 and breath sounds checked- equal and bilateral Secured at: 20 cm Tube secured with: Tape Dental Injury: Teeth and Oropharynx as per pre-operative assessment

## 2013-08-10 NOTE — Progress Notes (Signed)
Ur chart review completed.  

## 2013-08-10 NOTE — Anesthesia Postprocedure Evaluation (Signed)
  Anesthesia Post-op Note  Patient: Kathryn Buck  Procedure(s) Performed: Procedure(s): EXPLORATORY LAPAROTOMY; LEFT OVARIAN CYSTECTOMY (Left)  Patient Location: PACU  Anesthesia Type:General  Level of Consciousness: awake, alert  and oriented  Airway and Oxygen Therapy: Patient Spontanous Breathing  Post-op Pain: mild  Post-op Assessment: Post-op Vital signs reviewed, Patient's Cardiovascular Status Stable, Respiratory Function Stable, Patent Airway, No signs of Nausea or vomiting and Pain level controlled  Post-op Vital Signs: Reviewed and stable  Complications: No apparent anesthesia complications

## 2013-08-10 NOTE — Op Note (Signed)
08/10/2013  10:04 AM  PATIENT:  Kathryn Buck  27 y.o. female  PRE-OPERATIVE DIAGNOSIS:  left dermoid cyst, pelvic pain  POST-OPERATIVE DIAGNOSIS:  lefT OVARIAN MASS  PROCEDURE:  Procedure(s): EXPLORATORY LAPAROTOMY; LEFT OVARIAN CYSTECTOMY  SURGEON:  Surgeon(s): Ok Edwards, MD Dara Lords, MD  ANESTHESIA:   general  FINDINGS:large abdominal panniculus. 10 cm left ovarian cyst with no excrescences. Normal left fallopian tube. Normal right ovary and tube. Normal size uterus. Anterior and posterior cul-de-sac free of adhesions or endometriosis.  DESCRIPTION OF OPERATION:the patient was taken to the operating room where she underwent successful general endotracheal anesthesia. A time out was undertaken to identify the patient and the procedure to be undertaken. The patient's left side was previously marked as to the side to be removed. Patient did receive 2 g of Cefotan prophylactically as well as PAS stockings. Because of patient's prior history of DVT she received Lovenox 40 mg subcutaneous 2 hours before surgery. The abdomen was prepped and draped in the usual sterile fashion and a Foley catheter had been inserted for monitor his vision urinary output. A Pfannenstiel incision was made 2 cm above the symphysis pubis. The incision was carried out from the skin and subcutaneous tissue to the level of the rectus fascia. A midline neck was made and the fascia was extended in a transverse fashion. The peritoneal cavity was entered cautiously and the mass was able to be extruded outside the incision. Pelvic washings were obtained and submitted for cytological evaluation. Moist laps were placed around the mass and a circular incision was made around the cyst separating it from the normal ovarian tissue. With meticulous dissection the cyst was removed completely and submitted for pathological evaluation. The cyst did not rupture. The ovarian cortex was irrigated and hemostasis was undertaken  with the Bovie. The ovarian capsule was then reapproximated with a running Buxton stitch several layers to approximate both edges. There was adequate hemostasis the ovary was placed back in the pelvic cavity after copious irrigation. Sponge count and needle count were correct. The visceral peritoneum was not reapproximated. The rectus fascia was closed with a running stitch of 0 Vicryl suture. A JP drain was placed subcutaneous and a small incision was made patient's left lateral lower, wall for the drain and secured with 3-0 silk suture. The subcutaneous tissue was then reapproximated with 3-0 Vicryl suture. The skin was reapproximated with skin clips followed by placement Xeroform gauze and 4 x 4 dressing. Of note 0.25% was infiltrated subcutaneous at incision sites for a total of 10 cc. Patient was extubated and transferred to recovery room with stable vital signs.  ESTIMATED BLOOD LOSS:100 cc   Intake/Output Summary (Last 24 hours) at 08/10/13 1004 Last data filed at 08/10/13 1610  Gross per 24 hour  Intake   2300 ml  Output    200 ml  Net   2100 ml     BLOOD ADMINISTERED:none   LOCAL MEDICATIONS USED:  MARCAINE   0.25% subcutaneous at incision site postop 10 cc  SPECIMEN:  Source of Specimen:  Left ovarian cyst  DISPOSITION OF SPECIMEN:  PATHOLOGY  COUNTS:  YES  PLAN OF CARE: Transfer to PACU  [facility] HMD10:04 AMTD@

## 2013-08-10 NOTE — Transfer of Care (Signed)
Immediate Anesthesia Transfer of Care Note  Patient: Kathryn Buck  Procedure(s) Performed: Procedure(s): EXPLORATORY LAPAROTOMY; LEFT OVARIAN CYSTECTOMY (Left)  Patient Location: PACU  Anesthesia Type:General  Level of Consciousness: awake, alert  and oriented  Airway & Oxygen Therapy: Patient Spontanous Breathing and Patient connected to nasal cannula oxygen  Post-op Assessment: Report given to PACU RN and Post -op Vital signs reviewed and stable  Post vital signs: Reviewed and stable  Complications: No apparent anesthesia complications

## 2013-08-11 ENCOUNTER — Encounter (HOSPITAL_COMMUNITY): Payer: Self-pay | Admitting: Gynecology

## 2013-08-11 LAB — CBC
HCT: 36.1 % (ref 36.0–46.0)
Hemoglobin: 12.1 g/dL (ref 12.0–15.0)
MCH: 27.4 pg (ref 26.0–34.0)
MCHC: 33.5 g/dL (ref 30.0–36.0)

## 2013-08-11 MED ORDER — DIPHENHYDRAMINE HCL 25 MG PO CAPS
25.0000 mg | ORAL_CAPSULE | Freq: Four times a day (QID) | ORAL | Status: DC | PRN
Start: 2013-08-11 — End: 2013-08-11
  Administered 2013-08-11: 25 mg via ORAL
  Filled 2013-08-11: qty 1

## 2013-08-11 MED ORDER — HYDROCODONE-ACETAMINOPHEN 5-325 MG PO TABS
1.0000 | ORAL_TABLET | ORAL | Status: DC | PRN
  Administered 2013-08-11: 2 via ORAL
  Filled 2013-08-11: qty 2

## 2013-08-11 MED ORDER — DIPHENHYDRAMINE HCL 25 MG PO CAPS: 25.0000 mg | ORAL_CAPSULE | Freq: Four times a day (QID) | ORAL | Status: DC | PRN

## 2013-08-11 MED ORDER — HYDROCODONE-ACETAMINOPHEN 5-325 MG PO TABS: 1.0000 | ORAL_TABLET | ORAL | Status: DC | PRN

## 2013-08-11 MED ORDER — METOCLOPRAMIDE HCL 10 MG PO TABS: 10.0000 mg | ORAL_TABLET | Freq: Three times a day (TID) | ORAL | Status: DC

## 2013-08-11 MED FILL — Heparin Sodium (Porcine) Inj 5000 Unit/ML: INTRAMUSCULAR | Qty: 1 | Status: AC

## 2013-08-11 NOTE — Discharge Summary (Signed)
Physician Discharge Summary  Patient ID: Kathryn Buck MRN: [id number] DOB/AGE: [birth year] 27 y.o.  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 The patient had less than 100 cc blood loss for her operation. A Jackson-Pratt drain was placed for 24 hours postop. Due to patient's past history of DVT she was started on Lovenox 40 mg subcutaneous 2 hours before surgery and continue every 24 hours and we'll do so after discharge for 4 months to prevent recurrence. Patient did well postoperatively. She remained afebrile. She was started on clear liquid diet and was advanced to a regular diet. Her incision site remained intact good bowel sounds. On eating rounds today the Jackson-Pratt drain was removed since it had less than 20 cc over the past 48 hours. Visual return to the office next week to have staples removed. Her postop hemoglobin and hematocrit were 12.1 and 36.1 respectively.  Consults: None  Significant Diagnostic Studies: labs: Preop hemoglobin and hematocrit 12.9 and 38.2 and postop hemoglobin and hematocrit 12.1 and 36.1  Treatments: surgery: Exploratory laparotomy, left ovarian cystectomy, placement of subcutaneous JP drain  Discharge Exam: Blood pressure 117/63, pulse 98, temperature 98.9 F (37.2 C), temperature source Oral, resp. rate 20, height 5\' 2"  (1.575 m), weight 239 lb (108.41 kg), SpO2 96.00%. General appearance: alert and cooperative Resp: clear to auscultation bilaterally Cardio: regular rate and rhythm, S1, S2 normal, no murmur, click, rub or gallop GI: soft, non-tender; bowel sounds normal; no masses,  no organomegaly Extremities: extremities normal, atraumatic, no  cyanosis or edema Incision/Wound: intact  Disposition: 01-Home or Self Care  Discharge Orders   Future Orders Complete By Expires   Call MD for:  redness, tenderness, or signs of infection (pain, swelling, bleeding, redness, odor or green/yellow discharge around incision site)  As directed    Call MD for:  severe or increased pain, loss or decreased feeling  in affected limb(s)  As directed    Call MD for:  temperature >100.5  As directed    Discharge instructions  As directed    Scheduling Instructions:     Continue the Lovenox every morning to complete 1 month   Comments:     Call office tomorrow [phone] to make appointment for Tuesday after labor day to remove staples. Appointment with Maryelizabeth Rowan   Driving Restrictions  As directed    Comments:     No driving for 1  weeks   Lifting restrictions  As directed    Comments:     No lifting for 6 weeks   Resume previous diet  As directed        Medication List    STOP taking these medications       acetaminophen 500 MG tablet  Commonly known as:  TYLENOL     medroxyPROGESTERone 150 MG/ML injection  Commonly known as:  DEPO-PROVERA      TAKE these medications       diphenhydrAMINE 25 mg capsule  Commonly known as:  BENADRYL  Take 1 capsule (25 mg total) by mouth every 6 (six) hours as needed for itching.     enoxaparin 40 MG/0.4ML injection  Commonly known as:  LOVENOX  Sub-Q inject 40mg  daily x 4 weeks after surgery.  HYDROcodone-acetaminophen 5-325 MG per tablet  Commonly known as:  NORCO/VICODIN  Take 1-2 tablets by mouth every 4 (four) hours as needed.     metoCLOPramide 10 MG tablet  Commonly known as:  REGLAN  Take 1 tablet (10 mg total) by mouth 3 (three) times daily with meals.     pseudoephedrine-guaifenesin 60-600 MG per tablet  Commonly known as:  MUCINEX D  Take 1 tablet by mouth every 12 (twelve) hours.         SignedOk Edwards 08/11/2013, 6:39 PM

## 2013-08-11 NOTE — Progress Notes (Signed)
Patient discharged home with family member. Discharge instructions reviewed and discussed with patient, and patient stated verbal understanding of instructions. No home health or equipment needed. Patient in stable condition, walked down to main entrance by Luisa Dago, NT.

## 2013-08-11 NOTE — Progress Notes (Signed)
1 Day Post-Op Procedure(s) (LRB): EXPLORATORY LAPAROTOMY; LEFT OVARIAN CYSTECTOMY (Left)  Subjective: Patient reports tolerating PO.    Objective: I have reviewed patient's vital signs, intake and output, medications and labs.  Preoperative hemoglobin 12.9 postop hemoglobin 12.1.  General: alert and cooperative Resp: clear to auscultation bilaterally Cardio: regular rate and rhythm, S1, S2 normal, no murmur, click, rub or gallop GI: soft, non-tender; bowel sounds normal; no masses,  no organomegaly Extremities: extremities normal, atraumatic, no cyanosis or edema No vaginal bleeding, and JP drain less than 30 cc the past 8 hours  Assessment:. s/p Procedure(s): EXPLORATORY LAPAROTOMY; LEFT OVARIAN CYSTECTOMY (Left): stable, progressing well and tolerating diet  Plan:will advance to regular diet. We'll encourage ambulation. Patient received her 40 mg of Lovenox subcutaneous today. Her last antibiotic dose of cefotetan 2 g is at 10:00 this morning. Foley catheter was DC'd at 0600 hrs. This morning. The patient will be encouraged to continue ambulation and to take a shower today and we will reassess this afternoon or possibly discharging home later today or early tomorrow morning. Pathology report pending. Will remove JP drain at time of discharge   LOS: 1 day    [facility] H 08/11/2013, 8:09 AM

## 2013-08-18 ENCOUNTER — Encounter: Payer: Self-pay | Admitting: Gynecology

## 2013-08-18 ENCOUNTER — Ambulatory Visit (INDEPENDENT_AMBULATORY_CARE_PROVIDER_SITE_OTHER): Payer: 59 | Admitting: Gynecology

## 2013-08-18 DIAGNOSIS — Z9889 Other specified postprocedural states: Secondary | ICD-10-CM

## 2013-08-18 NOTE — Progress Notes (Signed)
Patient presents to have her staples removed. She is status post exploratory laparotomy left ovarian cystectomy by Dr. Lily Peer 08/10/2013. She reports doing well with minimal pain.  Exam Abdomen soft, minimally tender with incision line dry, intact.  Most of the staples were removed and replaced with Steri-Strips and benzoin. I left 5 midline staples in place to support the Steri-Strips and she'll return in several days to have these removed also.

## 2013-08-18 NOTE — Patient Instructions (Signed)
Followup in several days to have the rest of your staples removed.

## 2013-08-21 ENCOUNTER — Encounter: Payer: Self-pay | Admitting: Gynecology

## 2013-08-21 ENCOUNTER — Ambulatory Visit (INDEPENDENT_AMBULATORY_CARE_PROVIDER_SITE_OTHER): Payer: 59 | Admitting: Gynecology

## 2013-08-21 DIAGNOSIS — Z9889 Other specified postprocedural states: Secondary | ICD-10-CM

## 2013-08-21 NOTE — Patient Instructions (Signed)
Followup in one week for reexamination

## 2013-08-21 NOTE — Progress Notes (Signed)
Patient presents to have the remainder of her staples removed. She's done well without complaints.  Incision line healing with skin intact. Benzoyne applied around the remaining staples and these were removed and Steri-Strips applied.  Patient asked to followup in one week for reexamination. Sooner if any issues.

## 2013-09-01 ENCOUNTER — Ambulatory Visit (INDEPENDENT_AMBULATORY_CARE_PROVIDER_SITE_OTHER): Payer: 59 | Admitting: Gynecology

## 2013-09-01 ENCOUNTER — Encounter: Payer: Self-pay | Admitting: Gynecology

## 2013-09-01 VITALS — BP 132/84

## 2013-09-01 DIAGNOSIS — Z9889 Other specified postprocedural states: Secondary | ICD-10-CM

## 2013-09-01 NOTE — Progress Notes (Signed)
Patient presented to the office today for a three-week postop visit she is status post exploratory laparotomy with left ovarian cystectomy for left adnexal mass. Pathology report as follows:  Ovary, cyst - MATURE CYSTIC TERATOMA (DERMOID CYST). - NO ATYPIA OR MALIGNANCY.  Patient is doing well otherwise. Due to the fact the patient has had past history of DVT she had been started on Lovenox 40 mg subcutaneous 2 hours before surgery and she has currently been administering it at home every 24 hours and she will complete the 1 month recommended prophylaxis next week.  Exam: Abdomen: Soft nontender no rebound or guarding Pfannenstiel completely healed. Pelvic: And urethra Skene was within normal limits Vagina: No lesions or discharge Cervix: No lesions or discharge Uterus: Anteverted normal size shape and consistency nontender Adnexa: No palpable mass or tenderness rectal exam deferred  Assessment/plan: Patient 3 weeks status post exploratory laparotomy with left ovarian cystectomy for dermoid cyst doing well. A note was provided for her to return back to work in 2 weeks. She will complete her Lovenox treatment next week. She may resume for normal activity at the end of the month.

## 2013-09-01 NOTE — Patient Instructions (Addendum)
Finish 1 more week of Lovenox

## 2013-09-04 ENCOUNTER — Telehealth: Payer: Self-pay

## 2013-09-04 NOTE — Telephone Encounter (Signed)
Please write a note that she can go back to work next week but no lifting until the week after going back to work.

## 2013-09-04 NOTE — Telephone Encounter (Signed)
Patient states she has a second job.  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  Her second job is [employer] and she is sitting with a patient.  She wants to go back to that this weekend and next week as there is nothing really physical to it.  However, the employer wants a note saying she can return on 09/05/13 and any restrictions should be included in the note.  Patient was not sure if there were restrictions still in place at this point.  Please advise regarding restrictions and if ok to return 09/05/13 to secondary job. Toniann Fail will write note for her. Thanks

## 2013-09-07 ENCOUNTER — Encounter: Payer: Self-pay | Admitting: Gynecology

## 2013-09-07 NOTE — Telephone Encounter (Signed)
Sent wendy staff message for this to be done.

## 2013-09-14 NOTE — Telephone Encounter (Signed)
Toniann Fail handled this and letter faxed.

## 2013-09-16 ENCOUNTER — Encounter: Payer: Self-pay | Admitting: Women's Health

## 2013-09-16 ENCOUNTER — Other Ambulatory Visit (HOSPITAL_COMMUNITY)
Admission: RE | Admit: 2013-09-16 | Discharge: 2013-09-16 | Disposition: A | Discharge status: Home / Self Care | Payer: 59 | Source: Ambulatory Visit | Attending: Gynecology | Admitting: Gynecology

## 2013-09-16 ENCOUNTER — Ambulatory Visit (INDEPENDENT_AMBULATORY_CARE_PROVIDER_SITE_OTHER): Payer: 59 | Admitting: Women's Health

## 2013-09-16 VITALS — BP 124/80 | Ht 62.0 in | Wt 240.0 lb

## 2013-09-16 DIAGNOSIS — Z01419 Encounter for gynecological examination (general) (routine) without abnormal findings: Secondary | ICD-10-CM | POA: Insufficient documentation

## 2013-09-16 DIAGNOSIS — Z113 Encounter for screening for infections with a predominantly sexual mode of transmission: Secondary | ICD-10-CM

## 2013-09-16 DIAGNOSIS — Z309 Encounter for contraceptive management, unspecified: Secondary | ICD-10-CM

## 2013-09-16 DIAGNOSIS — IMO0001 Reserved for inherently not codable concepts without codable children: Secondary | ICD-10-CM

## 2013-09-16 DIAGNOSIS — Z833 Family history of diabetes mellitus: Secondary | ICD-10-CM

## 2013-09-16 LAB — HEMOGLOBIN A1C
Hgb A1c MFr Bld: 5.9 % — ABNORMAL HIGH (ref <5.7)
Mean Plasma Glucose: 123 mg/dL — ABNORMAL HIGH (ref <117)

## 2013-09-16 LAB — HEPATITIS C ANTIBODY: HCV Ab: NEGATIVE

## 2013-09-16 LAB — RPR

## 2013-09-16 LAB — HIV ANTIBODY (ROUTINE TESTING W REFLEX): HIV: NONREACTIVE

## 2013-09-16 MED ORDER — MEDROXYPROGESTERONE ACETATE 150 MG/ML IM SUSP: 150.0000 mg | INTRAMUSCULAR | Status: DC

## 2013-09-16 NOTE — Patient Instructions (Addendum)

## 2013-09-16 NOTE — Progress Notes (Signed)
Kathryn Buck 1986/06/29 [id number]    History:    The patient presents for annual exam.  Amenorrhea on Depo-Provera.  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 Benign dermoid cyst removed 07/2013 had been on Lovenox for one month after surgery without problem. Gardasil series completed. Normal Pap history. History of GC/Chlamydia. New partner. Elevated blood pressures in the past on no medication.  Past medical history, past surgical history, family history and social history were all reviewed and documented in the EPIC chart. 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   ROS:  A  ROS was performed and pertinent positives and negatives are included in the history.  Exam:  Filed Vitals:   09/16/13 1152  BP: 124/80    General appearance:  Normal Head/Neck:  Normal, without cervical or supraclavicular adenopathy. Thyroid:  Symmetrical, normal in size, without palpable masses or nodularity. Respiratory  Effort:  Normal  Auscultation:  Clear without wheezing or rhonchi Cardiovascular  Auscultation:  Regular rate, without rubs, murmurs or gallops  Edema/varicosities:  Not grossly evident Abdominal  Soft,nontender, without masses, guarding or rebound.  Liver/spleen:  No organomegaly noted  Hernia:  None appreciated  Skin  Inspection:  Grossly normal  Palpation:  Grossly normal Neurologic/psychiatric  Orientation:  Normal with appropriate conversation.  Mood/affect:  Normal  Genitourinary    Breasts: Examined lying and sitting.     Right: Without masses, retractions, discharge or axillary adenopathy.     Left: Without masses, retractions, discharge or axillary adenopathy.   Inguinal/mons:  Normal without inguinal adenopathy  External genitalia:  Normal  BUS/Urethra/Skene's glands:  Normal  Bladder:  Normal  Vagina:  Normal  Cervix:  Normal  Uterus:   normal in size, shape and contour.  Midline and mobile  Adnexa/parametria:     Rt: Without masses or  tenderness.   Lt: Without masses or tenderness.  Anus and perineum: Normal  Digital rectal exam: Normal sphincter tone without palpated masses or tenderness  Assessment/Plan:  27 y.o. SBF G0 for annual exam with no complaints.  Depo-Provera DVT 2003 Dermoid cyst 07/2013 Morbid obesity STD screen  Plan: Depo-Provera 150 prescription given reviewed importance of regular exercise, calcium rich diet. Discussion on importance of weight loss for health, Weight Watchers reviewed and encouraged. SBE's, increase regular exercise, decrease calories, MVI daily encouraged. Hemoglobin A1c, GC/Chlamydia, HIV, hep B, C., RPR. Pap. Pap normal 2012, new screening guidelines reviewed.      Harrington Challenger WHNP, 1:16 PM 09/16/2013

## 2013-09-17 LAB — URINALYSIS W MICROSCOPIC + REFLEX CULTURE
Casts: NONE SEEN
Crystals: NONE SEEN
Glucose, UA: NEGATIVE mg/dL
Leukocytes, UA: NEGATIVE
Nitrite: NEGATIVE
Specific Gravity, Urine: 1.027 (ref 1.005–1.030)
Squamous Epithelial / LPF: NONE SEEN
pH: 6 (ref 5.0–8.0)

## 2013-09-17 LAB — GC/CHLAMYDIA PROBE AMP: CT Probe RNA: NEGATIVE

## 2013-10-22 ENCOUNTER — Ambulatory Visit (INDEPENDENT_AMBULATORY_CARE_PROVIDER_SITE_OTHER): Payer: 59 | Admitting: Anesthesiology

## 2013-10-22 ENCOUNTER — Other Ambulatory Visit: Payer: Self-pay

## 2013-10-22 DIAGNOSIS — Z309 Encounter for contraceptive management, unspecified: Secondary | ICD-10-CM

## 2013-10-22 MED ORDER — MEDROXYPROGESTERONE ACETATE 150 MG/ML IM SUSP
150.0000 mg | Freq: Once | INTRAMUSCULAR | Status: AC
  Administered 2013-10-22: 150 mg via INTRAMUSCULAR

## 2014-01-08 ENCOUNTER — Ambulatory Visit (INDEPENDENT_AMBULATORY_CARE_PROVIDER_SITE_OTHER): Payer: 59 | Admitting: Anesthesiology

## 2014-01-08 DIAGNOSIS — Z309 Encounter for contraceptive management, unspecified: Secondary | ICD-10-CM

## 2014-01-08 MED ORDER — MEDROXYPROGESTERONE ACETATE 150 MG/ML IM SUSP
150.0000 mg | Freq: Once | INTRAMUSCULAR | Status: AC
  Administered 2014-01-08: 150 mg via INTRAMUSCULAR

## 2014-01-22 ENCOUNTER — Ambulatory Visit: Payer: 59 | Admitting: Women's Health

## 2014-03-31 ENCOUNTER — Ambulatory Visit (INDEPENDENT_AMBULATORY_CARE_PROVIDER_SITE_OTHER): Payer: 59 | Admitting: *Deleted

## 2014-03-31 DIAGNOSIS — Z3049 Encounter for surveillance of other contraceptives: Secondary | ICD-10-CM

## 2014-03-31 MED ORDER — MEDROXYPROGESTERONE ACETATE 150 MG/ML IM SUSP
150.0000 mg | Freq: Once | INTRAMUSCULAR | Status: AC
  Administered 2014-03-31: 150 mg via INTRAMUSCULAR

## 2014-06-23 ENCOUNTER — Ambulatory Visit (INDEPENDENT_AMBULATORY_CARE_PROVIDER_SITE_OTHER): Payer: 59 | Admitting: Anesthesiology

## 2014-06-23 DIAGNOSIS — Z3049 Encounter for surveillance of other contraceptives: Secondary | ICD-10-CM

## 2014-06-23 DIAGNOSIS — Z3042 Encounter for surveillance of injectable contraceptive: Secondary | ICD-10-CM

## 2014-06-23 MED ORDER — MEDROXYPROGESTERONE ACETATE 150 MG/ML IM SUSP
150.0000 mg | Freq: Once | INTRAMUSCULAR | Status: AC
  Administered 2014-06-23: 150 mg via INTRAMUSCULAR

## 2014-09-14 ENCOUNTER — Ambulatory Visit (INDEPENDENT_AMBULATORY_CARE_PROVIDER_SITE_OTHER): Payer: 59 | Admitting: *Deleted

## 2014-09-14 DIAGNOSIS — Z3049 Encounter for surveillance of other contraceptives: Secondary | ICD-10-CM

## 2014-09-14 MED ORDER — MEDROXYPROGESTERONE ACETATE 150 MG/ML IM SUSP
150.0000 mg | Freq: Once | INTRAMUSCULAR | Status: AC
  Administered 2014-09-14: 150 mg via INTRAMUSCULAR

## 2014-09-15 ENCOUNTER — Ambulatory Visit: Payer: 59

## 2014-09-29 ENCOUNTER — Encounter: Payer: Self-pay | Admitting: Women's Health

## 2014-09-29 ENCOUNTER — Ambulatory Visit (INDEPENDENT_AMBULATORY_CARE_PROVIDER_SITE_OTHER): Payer: 59 | Admitting: Women's Health

## 2014-09-29 VITALS — BP 130/86 | Ht 63.0 in | Wt 241.0 lb

## 2014-09-29 DIAGNOSIS — Z3042 Encounter for surveillance of injectable contraceptive: Secondary | ICD-10-CM

## 2014-09-29 DIAGNOSIS — R1900 Intra-abdominal and pelvic swelling, mass and lump, unspecified site: Secondary | ICD-10-CM

## 2014-09-29 DIAGNOSIS — B3731 Acute candidiasis of vulva and vagina: Secondary | ICD-10-CM

## 2014-09-29 DIAGNOSIS — Z833 Family history of diabetes mellitus: Secondary | ICD-10-CM

## 2014-09-29 DIAGNOSIS — Z13220 Encounter for screening for lipoid disorders: Secondary | ICD-10-CM

## 2014-09-29 DIAGNOSIS — Z113 Encounter for screening for infections with a predominantly sexual mode of transmission: Secondary | ICD-10-CM

## 2014-09-29 DIAGNOSIS — Z01419 Encounter for gynecological examination (general) (routine) without abnormal findings: Secondary | ICD-10-CM

## 2014-09-29 DIAGNOSIS — B373 Candidiasis of vulva and vagina: Secondary | ICD-10-CM

## 2014-09-29 LAB — WET PREP FOR TRICH, YEAST, CLUE
Clue Cells Wet Prep HPF POC: NONE SEEN
Trich, Wet Prep: NONE SEEN
YEAST WET PREP: NONE SEEN

## 2014-09-29 MED ORDER — FLUCONAZOLE 150 MG PO TABS: 150.0000 mg | ORAL_TABLET | Freq: Once | ORAL | Status: DC

## 2014-09-29 MED ORDER — MEDROXYPROGESTERONE ACETATE 150 MG/ML IM SUSP: 150.0000 mg | INTRAMUSCULAR | Status: DC

## 2014-09-29 NOTE — Patient Instructions (Signed)

## 2014-09-29 NOTE — Progress Notes (Signed)
Kathryn Buck 12-Apr-1986 [id number]    History:    Presents for annual exam.  Amenorrhea with Depo Provera. Normal pap history.  2003 DVT on ortho evra patch.  GC/chlamydia past.  2014 dermoid cyst. Gardasil series completed.   Past medical history, past surgical history, family history and social history were all reviewed and documented in the EPIC chart. 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 Mother died of heart disease at age 82.  ROS:  A  12 point ROS was performed and pertinent positives and negatives are included.  Exam:  Filed Vitals:   09/29/14 1209  BP: 130/86    General appearance:  Normal Thyroid:  Symmetrical, normal in size, without palpable masses or nodularity. Respiratory  Auscultation:  Clear without wheezing or rhonchi Cardiovascular  Auscultation:  Regular rate, without rubs, murmurs or gallops  Edema/varicosities:  Not grossly evident Abdominal  Soft,nontender, without masses, guarding or rebound.  Liver/spleen:  No organomegaly noted  Hernia:  None appreciated  Skin  Inspection:  Grossly normal   Breasts: Examined lying and sitting.     Right: Without masses, retractions, discharge or axillary adenopathy.     Left: Without masses, retractions, discharge or axillary adenopathy. Gentitourinary   Inguinal/mons:  Normal without inguinal adenopathy  External genitalia:  Normal  BUS/Urethra/Skene's glands:  Normal  Vagina:  Normal  Cervix:  Normal  Uterus:   normal in size, shape and contour.  Midline and mobile  Adnexa/parametria:     Rt: Without masses or tenderness.   Lt: Without masses or tenderness.  Anus and perineum: Normal  Digital rectal exam: Normal sphincter tone without palpated masses or tenderness  Assessment/Plan:  28 y.o. SBF G0 for annual exam.    Morbid obesity Amenorrhea on Depo-Provera Dermoid cyst 2014 2003 DVT on Ortho Evra patch  Plan: SBE's, increased exercise and decrease calories for weight loss, calcium  rich diet, MVI daily encouraged. Depo-Provera 150 every 12 weeks prescription, proper use given and reviewed need for increased calcium intake. CBC, lipid panel, glucose, hemoglobin A1c, UA, Pap normal 2014, new screening guidelines reviewed. Had taken an antibiotic last week for UTI wet prep negative but having some itching Diflucan 150 by mouth times one dose.   Kathryn Buck J WHNP, 1:20 PM 09/29/2014

## 2014-09-30 LAB — CBC WITH DIFFERENTIAL/PLATELET
Basophils Absolute: 0.1 10*3/uL (ref 0.0–0.1)
Basophils Relative: 1 % (ref 0–1)
EOS ABS: 0.3 10*3/uL (ref 0.0–0.7)
EOS PCT: 4 % (ref 0–5)
HCT: 42.6 % (ref 36.0–46.0)
HEMOGLOBIN: 14.1 g/dL (ref 12.0–15.0)
LYMPHS ABS: 3 10*3/uL (ref 0.7–4.0)
LYMPHS PCT: 40 % (ref 12–46)
MCH: 27.4 pg (ref 26.0–34.0)
MCHC: 33.1 g/dL (ref 30.0–36.0)
MCV: 82.7 fL (ref 78.0–100.0)
MONOS PCT: 7 % (ref 3–12)
Monocytes Absolute: 0.5 10*3/uL (ref 0.1–1.0)
NEUTROS PCT: 48 % (ref 43–77)
Neutro Abs: 3.6 10*3/uL (ref 1.7–7.7)
Platelets: 361 10*3/uL (ref 150–400)
RBC: 5.15 MIL/uL — AB (ref 3.87–5.11)
RDW: 13.9 % (ref 11.5–15.5)
WBC: 7.6 10*3/uL (ref 4.0–10.5)

## 2014-09-30 LAB — HEPATITIS B SURFACE ANTIGEN: Hepatitis B Surface Ag: NEGATIVE

## 2014-09-30 LAB — HEMOGLOBIN A1C
HEMOGLOBIN A1C: 6.3 % — AB (ref <5.7)
MEAN PLASMA GLUCOSE: 134 mg/dL — AB (ref <117)

## 2014-09-30 LAB — RPR

## 2014-09-30 LAB — HIV ANTIBODY (ROUTINE TESTING W REFLEX): HIV: NONREACTIVE

## 2014-09-30 LAB — GC/CHLAMYDIA PROBE AMP
CT PROBE, AMP APTIMA: NEGATIVE
GC Probe RNA: NEGATIVE

## 2014-09-30 LAB — HEPATITIS C ANTIBODY: HCV AB: NEGATIVE

## 2014-10-01 LAB — LIPID PANEL
CHOL/HDL RATIO: 2 ratio
Cholesterol: 82 mg/dL (ref 0–200)
HDL: 41 mg/dL (ref >39)
LDL Cholesterol: 21 mg/dL (ref 0–99)
TRIGLYCERIDES: 99 mg/dL (ref <150)
VLDL: 20 mg/dL (ref 0–40)

## 2014-10-01 LAB — GLUCOSE, RANDOM: GLUCOSE: 96 mg/dL (ref 70–99)

## 2014-10-04 ENCOUNTER — Other Ambulatory Visit: Payer: Self-pay | Admitting: Women's Health

## 2014-10-04 DIAGNOSIS — R739 Hyperglycemia, unspecified: Secondary | ICD-10-CM

## 2014-11-17 ENCOUNTER — Ambulatory Visit: Payer: 59 | Admitting: Women's Health

## 2015-10-07 ENCOUNTER — Other Ambulatory Visit (HOSPITAL_COMMUNITY)
Admission: RE | Admit: 2015-10-07 | Discharge: 2015-10-07 | Disposition: A | Discharge status: Home / Self Care | Payer: 59 | Source: Ambulatory Visit | Attending: Women's Health | Admitting: Women's Health

## 2015-10-07 ENCOUNTER — Encounter: Payer: Self-pay | Admitting: Women's Health

## 2015-10-07 ENCOUNTER — Ambulatory Visit (INDEPENDENT_AMBULATORY_CARE_PROVIDER_SITE_OTHER): Payer: 59 | Admitting: Women's Health

## 2015-10-07 ENCOUNTER — Other Ambulatory Visit: Payer: Self-pay | Admitting: Women's Health

## 2015-10-07 VITALS — BP 136/88 | Ht 61.5 in | Wt 245.0 lb

## 2015-10-07 DIAGNOSIS — N912 Amenorrhea, unspecified: Secondary | ICD-10-CM | POA: Diagnosis not present

## 2015-10-07 DIAGNOSIS — Z3042 Encounter for surveillance of injectable contraceptive: Secondary | ICD-10-CM | POA: Diagnosis not present

## 2015-10-07 DIAGNOSIS — Z113 Encounter for screening for infections with a predominantly sexual mode of transmission: Secondary | ICD-10-CM

## 2015-10-07 DIAGNOSIS — Z01419 Encounter for gynecological examination (general) (routine) without abnormal findings: Secondary | ICD-10-CM | POA: Diagnosis not present

## 2015-10-07 DIAGNOSIS — Z1151 Encounter for screening for human papillomavirus (HPV): Secondary | ICD-10-CM | POA: Insufficient documentation

## 2015-10-07 LAB — LIPID PANEL
Cholesterol: 106 mg/dL — ABNORMAL LOW (ref 125–200)
HDL: 37 mg/dL — AB (ref >=46)
LDL CALC: 43 mg/dL (ref <130)
TRIGLYCERIDES: 132 mg/dL (ref <150)
Total CHOL/HDL Ratio: 2.9 Ratio (ref <=5.0)
VLDL: 26 mg/dL (ref <30)

## 2015-10-07 LAB — CBC WITH DIFFERENTIAL/PLATELET
BASOS ABS: 0 10*3/uL (ref 0.0–0.1)
BASOS PCT: 0 % (ref 0–1)
EOS ABS: 0.3 10*3/uL (ref 0.0–0.7)
EOS PCT: 3 % (ref 0–5)
HEMATOCRIT: 44 % (ref 36.0–46.0)
Hemoglobin: 14.2 g/dL (ref 12.0–15.0)
LYMPHS PCT: 37 % (ref 12–46)
Lymphs Abs: 3.7 10*3/uL (ref 0.7–4.0)
MCH: 26.8 pg (ref 26.0–34.0)
MCHC: 32.3 g/dL (ref 30.0–36.0)
MCV: 83 fL (ref 78.0–100.0)
MONO ABS: 0.6 10*3/uL (ref 0.1–1.0)
MPV: 9.7 fL (ref 8.6–12.4)
Monocytes Relative: 6 % (ref 3–12)
Neutro Abs: 5.3 10*3/uL (ref 1.7–7.7)
Neutrophils Relative %: 54 % (ref 43–77)
PLATELETS: 379 10*3/uL (ref 150–400)
RBC: 5.3 MIL/uL — AB (ref 3.87–5.11)
RDW: 13.9 % (ref 11.5–15.5)
WBC: 9.9 10*3/uL (ref 4.0–10.5)

## 2015-10-07 LAB — HEMOGLOBIN A1C
Hgb A1c MFr Bld: 6 % — ABNORMAL HIGH (ref <5.7)
MEAN PLASMA GLUCOSE: 126 mg/dL — AB (ref <117)

## 2015-10-07 LAB — TSH: TSH: 4.779 u[IU]/mL — AB (ref 0.350–4.500)

## 2015-10-07 NOTE — Progress Notes (Signed)
Kathryn Buck July 25, 1986 [id number]   History: Presents for annual exam. Amenorrhea for one year, spotting for 5-6 days in September, quit Depo Provera September 2015.  Normal pap history. 2003 DVT on ortho evra patch. GC/chlamydia past. 2014 dermoid cyst. Gardasil series completed. Sexually active, multiple partners, condoms every time. Wants to conceive when finds right partner. Gained 3 lbs over the year, trying to eat healthier and doing water aerobics for exercise.   Past medical history, past surgical history, family history and social history were all reviewed and documented in the EPIC chart. 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 Mother died of heart disease at age 49. Sister is about to get married.   ROS: A 12 point ROS was performed and pertinent positives and negatives are included.  Past medical history, past surgical history, family history and social history were all reviewed and documented in the EPIC chart.  ROS:  A ROS was performed and pertinent positives and negatives are included.  Exam:  Filed Vitals:   10/07/15 0834  BP: 136/88    General appearance:  Normal Thyroid:  Symmetrical, normal in size, without palpable masses or nodularity. Respiratory  Auscultation:  Clear without wheezing or rhonchi Cardiovascular  Auscultation:  Regular rate, without rubs, murmurs or gallops  Edema/varicosities:  Not grossly evident Abdominal  Soft,nontender, without masses, guarding or rebound.  Liver/spleen:  No organomegaly noted  Hernia:  None appreciated  Skin  Inspection:  Grossly normal, normal skin tag noted on left abdominal region    Breasts: Examined lying and sitting.     Right: Without masses, retractions, discharge or axillary adenopathy.     Left: Without masses, retractions, discharge or axillary adenopathy. Gentitourinary   Inguinal/mons:  Normal without inguinal adenopathy  External genitalia:  Normal  BUS/Urethra/Skene's glands:   Normal  Vagina:  Normal  Cervix:  Normal  Uterus:  normal in size, shape and contour.  Midline and mobile  Adnexa/parametria:     Rt: Without masses or tenderness.   Lt: Without masses or tenderness.  Anus and perineum: Normal  Digital rectal exam: Normal sphincter tone without palpated masses or tenderness  Assessment/Plan:  29 y.o. SBF G0 for annual exam.   Amenorrhea since quit Depo-Provera one year ago  STD screening Morbid obesity Dermoid cyst 2014 2003 DVT on Ortho Evra patch  Plan: [place], instructed to schedule. CBC, prolactin and TSH. RPR, GC/Chlamydia, HIV antibody, Hep B and C. Vit D, lipid, HgBA1C, cytology-PAP with HR HPV typing screening guidelines reviewed.  Increase exercise and decrease calories for weight loss, continue water aerobics. Gastric sleeve  surgery discussed, Weight Watchers encouraged.  MVI daily encouraged. SBE's. Follow-up with PCP, instructed to take copy of labs. Return in one year for annual GYN exam, or sooner if questions or concerns. Continue to use condoms every time, declines other contraception.    [place], 9:59 AM 10/07/2015

## 2015-10-07 NOTE — Patient Instructions (Signed)
Health Maintenance, Female Adopting a healthy lifestyle and getting preventive care can go a long way to promote health and wellness. Talk with your health care provider about what schedule of regular examinations is right for you. This is a good chance for you to check in with your provider about disease prevention and staying healthy. In between checkups, there are plenty of things you can do on your own. Experts have done a lot of research about which lifestyle changes and preventive measures are most likely to keep you healthy. Ask your health care provider for more information. WEIGHT AND DIET  Eat a healthy diet  Be sure to include plenty of vegetables, fruits, low-fat dairy products, and lean protein.  Do not eat a lot of foods high in solid fats, added sugars, or salt.  Get regular exercise. This is one of the most important things you can do for your health.  Most adults should exercise for at least 150 minutes each week. The exercise should increase your heart rate and make you sweat (moderate-intensity exercise).  Most adults should also do strengthening exercises at least twice a week. This is in addition to the moderate-intensity exercise.  Maintain a healthy weight  Body mass index (BMI) is a measurement that can be used to identify possible weight problems. It estimates body fat based on height and weight. Your health care provider can help determine your BMI and help you achieve or maintain a healthy weight.  For females 20 years of age and older:   A BMI below 18.5 is considered underweight.  A BMI of 18.5 to 24.9 is normal.  A BMI of 25 to 29.9 is considered overweight.  A BMI of 30 and above is considered obese.  Watch levels of cholesterol and blood lipids  You should start having your blood tested for lipids and cholesterol at 30 years of age, then have this test every 5 years.  You may need to have your cholesterol levels checked more often if:  Your lipid  or cholesterol levels are high.  You are older than 30 years of age.  You are at high risk for heart disease.  CANCER SCREENING   Lung Cancer  Lung cancer screening is recommended for adults 55-80 years old who are at high risk for lung cancer because of a history of smoking.  A yearly low-dose CT scan of the lungs is recommended for people who:  Currently smoke.  Have quit within the past 15 years.  Have at least a 30-pack-year history of smoking. A pack year is smoking an average of one pack of cigarettes a day for 1 year.  Yearly screening should continue until it has been 15 years since you quit.  Yearly screening should stop if you develop a health problem that would prevent you from having lung cancer treatment.  Breast Cancer  Practice breast self-awareness. This means understanding how your breasts normally appear and feel.  It also means doing regular breast self-exams. Let your health care provider know about any changes, no matter how small.  If you are in your 20s or 30s, you should have a clinical breast exam (CBE) by a health care provider every 1-3 years as part of a regular health exam.  If you are 40 or older, have a CBE every year. Also consider having a breast X-ray (mammogram) every year.  If you have a family history of breast cancer, talk to your health care provider about genetic screening.  If you   are at high risk for breast cancer, talk to your health care provider about having an MRI and a mammogram every year.  Breast cancer gene (BRCA) assessment is recommended for women who have family members with BRCA-related cancers. BRCA-related cancers include:  Breast.  Ovarian.  Tubal.  Peritoneal cancers.  Results of the assessment will determine the need for genetic counseling and BRCA1 and BRCA2 testing. Cervical Cancer Your health care provider may recommend that you be screened regularly for cancer of the pelvic organs (ovaries, uterus, and  vagina). This screening involves a pelvic examination, including checking for microscopic changes to the surface of your cervix (Pap test). You may be encouraged to have this screening done every 3 years, beginning at age 21.  For women ages 30-65, health care providers may recommend pelvic exams and Pap testing every 3 years, or they may recommend the Pap and pelvic exam, combined with testing for human papilloma virus (HPV), every 5 years. Some types of HPV increase your risk of cervical cancer. Testing for HPV may also be done on women of any age with unclear Pap test results.  Other health care providers may not recommend any screening for nonpregnant women who are considered low risk for pelvic cancer and who do not have symptoms. Ask your health care provider if a screening pelvic exam is right for you.  If you have had past treatment for cervical cancer or a condition that could lead to cancer, you need Pap tests and screening for cancer for at least 20 years after your treatment. If Pap tests have been discontinued, your risk factors (such as having a new sexual partner) need to be reassessed to determine if screening should resume. Some women have medical problems that increase the chance of getting cervical cancer. In these cases, your health care provider may recommend more frequent screening and Pap tests. Colorectal Cancer  This type of cancer can be detected and often prevented.  Routine colorectal cancer screening usually begins at 30 years of age and continues through 30 years of age.  Your health care provider may recommend screening at an earlier age if you have risk factors for colon cancer.  Your health care provider may also recommend using home test kits to check for hidden blood in the stool.  A small camera at the end of a tube can be used to examine your colon directly (sigmoidoscopy or colonoscopy). This is done to check for the earliest forms of colorectal  cancer.  Routine screening usually begins at age 50.  Direct examination of the colon should be repeated every 5-10 years through 30 years of age. However, you may need to be screened more often if early forms of precancerous polyps or small growths are found. Skin Cancer  Check your skin from head to toe regularly.  Tell your health care provider about any new moles or changes in moles, especially if there is a change in a mole's shape or color.  Also tell your health care provider if you have a mole that is larger than the size of a pencil eraser.  Always use sunscreen. Apply sunscreen liberally and repeatedly throughout the day.  Protect yourself by wearing long sleeves, pants, a wide-brimmed hat, and sunglasses whenever you are outside. HEART DISEASE, DIABETES, AND HIGH BLOOD PRESSURE   High blood pressure causes heart disease and increases the risk of stroke. High blood pressure is more likely to develop in:  People who have blood pressure in the high end   of the normal range (130-139/85-89 mm Hg).  People who are overweight or obese.  People who are African American.  If you are 38-23 years of age, have your blood pressure checked every 3-5 years. If you are 61 years of age or older, have your blood pressure checked every year. You should have your blood pressure measured twice--once when you are at a hospital or clinic, and once when you are not at a hospital or clinic. Record the average of the two measurements. To check your blood pressure when you are not at a hospital or clinic, you can use:  An automated blood pressure machine at a pharmacy.  A home blood pressure monitor.  If you are between 45 years and 39 years old, ask your health care provider if you should take aspirin to prevent strokes.  Have regular diabetes screenings. This involves taking a blood sample to check your fasting blood sugar level.  If you are at a normal weight and have a low risk for diabetes,  have this test once every three years after 30 years of age.  If you are overweight and have a high risk for diabetes, consider being tested at a younger age or more often. PREVENTING INFECTION  Hepatitis B  If you have a higher risk for hepatitis B, you should be screened for this virus. You are considered at high risk for hepatitis B if:  You were born in a country where hepatitis B is common. Ask your health care provider which countries are considered high risk.  Your parents were born in a high-risk country, and you have not been immunized against hepatitis B (hepatitis B vaccine).  You have HIV or AIDS.  You use needles to inject street drugs.  You live with someone who has hepatitis B.  You have had sex with someone who has hepatitis B.  You get hemodialysis treatment.  You take certain medicines for conditions, including cancer, organ transplantation, and autoimmune conditions. Hepatitis C  Blood testing is recommended for:  Everyone born from [birth year] through 1966.  Anyone with known risk factors for hepatitis C. Sexually transmitted infections (STIs)  You should be screened for sexually transmitted infections (STIs) including gonorrhea and chlamydia if:  You are sexually active and are younger than 30 years of age.  You are older than 30 years of age and your health care provider tells you that you are at risk for this type of infection.  Your sexual activity has changed since you were last screened and you are at an increased risk for chlamydia or gonorrhea. Ask your health care provider if you are at risk.  If you do not have HIV, but are at risk, it may be recommended that you take a prescription medicine daily to prevent HIV infection. This is called pre-exposure prophylaxis (PrEP). You are considered at risk if:  You are sexually active and do not regularly use condoms or know the HIV status of your partner(s).  You take drugs by injection.  You are sexually  active with a partner who has HIV. Talk with your health care provider about whether you are at high risk of being infected with HIV. If you choose to begin PrEP, you should first be tested for HIV. You should then be tested every 3 months for as long as you are taking PrEP.  PREGNANCY   If you are premenopausal and you may become pregnant, ask your health care provider about preconception counseling.  If you may  become pregnant, take 400 to 800 micrograms (mcg) of folic acid every day.  If you want to prevent pregnancy, talk to your health care provider about birth control (contraception). OSTEOPOROSIS AND MENOPAUSE   Osteoporosis is a disease in which the bones lose minerals and strength with aging. This can result in serious bone fractures. Your risk for osteoporosis can be identified using a bone density scan.  If you are 61 years of age or older, or if you are at risk for osteoporosis and fractures, ask your health care provider if you should be screened.  Ask your health care provider whether you should take a calcium or vitamin D supplement to lower your risk for osteoporosis.  Menopause may have certain physical symptoms and risks.  Hormone replacement therapy may reduce some of these symptoms and risks. Talk to your health care provider about whether hormone replacement therapy is right for you.  HOME CARE INSTRUCTIONS   Schedule regular health, dental, and eye exams.  Stay current with your immunizations.   Do not use any tobacco products including cigarettes, chewing tobacco, or electronic cigarettes.  If you are pregnant, do not drink alcohol.  If you are breastfeeding, limit how much and how often you drink alcohol.  Limit alcohol intake to no more than 1 drink per day for nonpregnant women. One drink equals 12 ounces of beer, 5 ounces of wine, or 1 ounces of hard liquor.  Do not use street drugs.  Do not share needles.  Ask your health care provider for help if  you need support or information about quitting drugs.  Tell your health care provider if you often feel depressed.  Tell your health care provider if you have ever been abused or do not feel safe at home.   This information is not intended to replace advice given to you by your health care provider. Make sure you discuss any questions you have with your health care provider.   Document Released: 06/18/2011 Document Revised: 12/24/2014 Document Reviewed: 11/04/2013 Elsevier Interactive Patient Education [employer].

## 2015-10-08 LAB — VITAMIN D 25 HYDROXY (VIT D DEFICIENCY, FRACTURES): Vit D, 25-Hydroxy: 20 ng/mL — ABNORMAL LOW (ref 30–100)

## 2015-10-08 LAB — HEPATITIS B SURFACE ANTIGEN: Hepatitis B Surface Ag: NEGATIVE

## 2015-10-08 LAB — HEPATITIS C ANTIBODY: HCV Ab: NEGATIVE

## 2015-10-08 LAB — GC/CHLAMYDIA PROBE AMP
CT Probe RNA: NEGATIVE
GC PROBE AMP APTIMA: NEGATIVE

## 2015-10-08 LAB — HIV ANTIBODY (ROUTINE TESTING W REFLEX): HIV 1&2 Ab, 4th Generation: NONREACTIVE

## 2015-10-08 LAB — RPR

## 2015-10-08 LAB — PROLACTIN: PROLACTIN: 16.3 ng/mL

## 2015-10-10 LAB — CYTOLOGY - PAP

## 2015-10-10 LAB — T4: T4, Total: 7.6 ug/dL (ref 4.5–12.0)

## 2015-10-11 LAB — THYROID ANTIBODIES
Thyroglobulin Ab: <1 IU/mL (ref <2)
Thyroperoxidase Ab SerPl-aCnc: 3 IU/mL (ref <9)

## 2015-10-12 ENCOUNTER — Other Ambulatory Visit: Payer: Self-pay | Admitting: Women's Health

## 2015-10-12 DIAGNOSIS — B3731 Acute candidiasis of vulva and vagina: Secondary | ICD-10-CM

## 2015-10-12 DIAGNOSIS — E038 Other specified hypothyroidism: Secondary | ICD-10-CM

## 2015-10-12 DIAGNOSIS — B373 Candidiasis of vulva and vagina: Secondary | ICD-10-CM

## 2015-10-12 MED ORDER — LEVOTHYROXINE SODIUM 25 MCG PO TABS: 25.0000 ug | ORAL_TABLET | Freq: Every day | ORAL | Status: DC

## 2015-10-12 MED ORDER — FLUCONAZOLE 150 MG PO TABS: 150.0000 mg | ORAL_TABLET | Freq: Once | ORAL | Status: DC

## 2015-10-14 ENCOUNTER — Other Ambulatory Visit: Payer: Self-pay | Admitting: *Deleted

## 2015-10-14 DIAGNOSIS — E039 Hypothyroidism, unspecified: Secondary | ICD-10-CM

## 2015-10-21 ENCOUNTER — Ambulatory Visit (INDEPENDENT_AMBULATORY_CARE_PROVIDER_SITE_OTHER): Payer: 59 | Admitting: Women's Health

## 2015-10-21 ENCOUNTER — Ambulatory Visit (INDEPENDENT_AMBULATORY_CARE_PROVIDER_SITE_OTHER): Payer: 59

## 2015-10-21 ENCOUNTER — Encounter: Payer: Self-pay | Admitting: Women's Health

## 2015-10-21 ENCOUNTER — Other Ambulatory Visit: Payer: Self-pay | Admitting: Women's Health

## 2015-10-21 VITALS — BP 130/84 | Ht 61.0 in | Wt 245.0 lb

## 2015-10-21 DIAGNOSIS — N83202 Unspecified ovarian cyst, left side: Secondary | ICD-10-CM

## 2015-10-21 DIAGNOSIS — Z30011 Encounter for initial prescription of contraceptive pills: Secondary | ICD-10-CM | POA: Diagnosis not present

## 2015-10-21 DIAGNOSIS — E282 Polycystic ovarian syndrome: Secondary | ICD-10-CM

## 2015-10-21 DIAGNOSIS — N912 Amenorrhea, unspecified: Secondary | ICD-10-CM

## 2015-10-21 MED ORDER — NORETHINDRONE 0.35 MG PO TABS: 1.0000 | ORAL_TABLET | Freq: Every day | ORAL | Status: DC

## 2015-10-21 NOTE — Progress Notes (Signed)
Patient ID: Kathryn Buck, female   DOB: January 16, 1986, 29 y.o.   MRN: [id number] Presents for ultrasound. At annual exam amenorrhic approximately one year. Morbid obesity, mild hypothyroid placed on Synthroid 25  micrograms and had a cycle after.  Exam: Appears well. Ultrasound: T/V anteverted uterus homogeneous echo. Right ovary numerous follicles 4 mm. Left ovary thin-walled echo-free cyst 32 x 14 x 14 mm negative CFD, increase volume bilateral ovaries consistent with PCOS. Negative cul-de-sac. Endometrium 4 mm.  PCO S Mild hypothyroidism  Plan: Options reviewed, no combination OCs history of DVT. Micronor prescription, proper use given and reviewed slight risk for strokes, take daily, no placebo week. Instructed to call if problems with irregular bleeding. Reviewed PCOS and possible difficulty conceiving, denies desire to concieve at this time. Reviewed importance of decreasing calories and increasing exercise for weight loss.

## 2015-10-21 NOTE — Patient Instructions (Signed)
Polycystic Ovarian Syndrome  Polycystic ovarian syndrome (PCOS) is a common hormonal disorder among women of reproductive age. Most women with PCOS grow many small cysts on their ovaries. PCOS can cause problems with your periods and make it difficult to get pregnant. It can also cause an increased risk of miscarriage with pregnancy. If left untreated, PCOS can lead to serious health problems, such as diabetes and heart disease.  CAUSES  The cause of PCOS is not fully understood, but genetics may be a factor.  SIGNS AND SYMPTOMS   · Infrequent or no menstrual periods.    · Inability to get pregnant (infertility) because of not ovulating.    · Increased growth of hair on the face, chest, stomach, back, thumbs, thighs, or toes.    · Acne, oily skin, or dandruff.    · Pelvic pain.    · Weight gain or obesity, usually carrying extra weight around the waist.    · Type 2 diabetes.     · High cholesterol.    · High blood pressure.    · Female-pattern baldness or thinning hair.    · Patches of thickened and dark [place] or black skin on the neck, arms, breasts, or thighs.    · Tiny excess flaps of skin (skin tags) in the armpits or neck area.    · Excessive snoring and having breathing stop at times while asleep (sleep apnea).    · Deepening of the voice.    · Gestational diabetes when pregnant.    DIAGNOSIS   There is no single test to diagnose PCOS.   · Your health care provider will:      Take a medical history.      Perform a pelvic exam.      Have ultrasonography done.      Check your female and female hormone levels.      Measure glucose or sugar levels in the blood.      Do other blood tests.    · If you are producing too many female hormones, your health care provider will make sure it is from PCOS. At the physical exam, your health care provider will want to evaluate the areas of increased hair growth. Try to allow natural hair growth for a few days before the visit.    · During a pelvic exam, the ovaries may be enlarged  or swollen because of the increased number of small cysts. This can be seen more easily by using vaginal ultrasonography or screening to examine the ovaries and lining of the uterus (endometrium) for cysts. The uterine lining may become thicker if you have not been having a regular period.    TREATMENT   Because there is no cure for PCOS, it needs to be managed to prevent problems. Treatments are based on your symptoms. Treatment is also based on whether you want to have a baby or whether you need contraception.   Treatment may include:   · Progesterone hormone to start a menstrual period.    · Birth control pills to make you have regular menstrual periods.    · Medicines to make you ovulate, if you want to get pregnant.    · Medicines to control your insulin.    · Medicine to control your blood pressure.    · Medicine and diet to control your high cholesterol and triglycerides in your blood.  · Medicine to reduce excessive hair growth.   · Surgery, making small holes in the ovary, to decrease the amount of female hormone production. This is done through a long, lighted tube (laparoscope) placed into the pelvis through a tiny incision in the lower abdomen.      HOME CARE INSTRUCTIONS  · Only take over-the-counter or prescription medicine as directed by your health care provider.  · Pay attention to the foods you eat and your activity levels. This can help reduce the effects of PCOS.    Keep your weight under control.    Eat foods that are low in carbohydrate and high in fiber.    Exercise regularly.  SEEK MEDICAL CARE IF:  · Your symptoms do not get better with medicine.  · You have new symptoms.     This information is not intended to replace advice given to you by your health care provider. Make sure you discuss any questions you have with your health care provider.     Document Released: 03/29/2005 Document Revised: 09/23/2013 Document Reviewed: 05/21/2013  Elsevier Interactive Patient Education ©2016 Elsevier  Inc.

## 2015-10-25 ENCOUNTER — Other Ambulatory Visit: Payer: Self-pay | Admitting: Women's Health

## 2015-10-25 DIAGNOSIS — E559 Vitamin D deficiency, unspecified: Secondary | ICD-10-CM

## 2015-10-25 MED ORDER — VITAMIN D (ERGOCALCIFEROL) 1.25 MG (50000 UNIT) PO CAPS: 50000.0000 [IU] | ORAL_CAPSULE | ORAL | Status: DC

## 2015-12-08 ENCOUNTER — Other Ambulatory Visit: Payer: Self-pay | Admitting: Women's Health

## 2015-12-13 ENCOUNTER — Other Ambulatory Visit: Payer: Self-pay | Admitting: *Deleted

## 2015-12-13 DIAGNOSIS — E038 Other specified hypothyroidism: Secondary | ICD-10-CM

## 2015-12-16 ENCOUNTER — Other Ambulatory Visit: Payer: 59

## 2015-12-16 ENCOUNTER — Other Ambulatory Visit: Payer: Self-pay

## 2015-12-16 DIAGNOSIS — E559 Vitamin D deficiency, unspecified: Secondary | ICD-10-CM

## 2015-12-16 DIAGNOSIS — E039 Hypothyroidism, unspecified: Secondary | ICD-10-CM

## 2015-12-16 DIAGNOSIS — E038 Other specified hypothyroidism: Secondary | ICD-10-CM

## 2015-12-16 LAB — TSH: TSH: 2.066 u[IU]/mL (ref 0.350–4.500)

## 2015-12-16 MED ORDER — LEVOTHYROXINE SODIUM 25 MCG PO TABS: 25.0000 ug | ORAL_TABLET | Freq: Every day | ORAL | Status: DC

## 2015-12-17 LAB — VITAMIN D 25 HYDROXY (VIT D DEFICIENCY, FRACTURES): Vit D, 25-Hydroxy: 26 ng/mL — ABNORMAL LOW (ref 30–100)

## 2016-01-10 MED FILL — NORETHINDRONE 0.35 MG TAB: 0.35 | 84 days supply | Qty: 84 | Fill #1 | Status: TO

## 2016-01-10 MED FILL — LISINOPRIL 10 MG TABLET: 10 | 30 days supply | Qty: 30 | Fill #1 | Status: TO

## 2016-01-10 MED FILL — LEVOTHYROXINE 25 MCG TABLET: 25 | 30 days supply | Qty: 30 | Fill #0

## 2016-02-06 ENCOUNTER — Other Ambulatory Visit: Payer: Self-pay | Admitting: Women's Health

## 2016-02-06 ENCOUNTER — Other Ambulatory Visit: Payer: Self-pay | Admitting: *Deleted

## 2016-02-06 DIAGNOSIS — E038 Other specified hypothyroidism: Secondary | ICD-10-CM

## 2016-02-06 MED ORDER — LEVOTHYROXINE SODIUM 25 MCG PO TABS: 25.0000 ug | ORAL_TABLET | Freq: Every day | ORAL | Status: DC

## 2016-02-06 MED FILL — LEVOTHYROXINE 25 MCG TABLET: 25 | 30 days supply | Qty: 30 | Fill #0

## 2016-02-06 MED FILL — LISINOPRIL 10 MG TABLET: 10 | 30 days supply | Qty: 30 | Fill #2 | Status: TO

## 2016-02-06 NOTE — Telephone Encounter (Signed)
Pt called requesting refill on levothyroixine 25 mcg daily per result note on 12/24/15 Please call and review TSH normal range, continue same dosage. Rx will be sent.

## 2016-03-15 MED FILL — LISINOPRIL 10 MG TABLET: 10 | 30 days supply | Qty: 30 | Fill #3 | Status: TO

## 2016-03-15 MED FILL — LEVOTHYROXINE 25 MCG TABLET: 25 | 30 days supply | Qty: 30 | Fill #1 | Status: TO

## 2016-06-08 ENCOUNTER — Telehealth: Payer: Self-pay | Admitting: *Deleted

## 2016-06-08 DIAGNOSIS — Z30011 Encounter for initial prescription of contraceptive pills: Secondary | ICD-10-CM

## 2016-06-08 DIAGNOSIS — N912 Amenorrhea, unspecified: Secondary | ICD-10-CM

## 2016-06-08 MED ORDER — NORETHINDRONE 0.35 MG PO TABS: 1.0000 | ORAL_TABLET | Freq: Every day | ORAL | Status: DC

## 2016-06-08 NOTE — Telephone Encounter (Signed)
Pt has new pharmacy needs 90 day supply sent for her birth control, Rx sent to [facility] on [address].

## 2016-07-23 ENCOUNTER — Other Ambulatory Visit: Payer: Self-pay | Admitting: *Deleted

## 2016-07-23 DIAGNOSIS — E038 Other specified hypothyroidism: Secondary | ICD-10-CM

## 2016-07-23 MED ORDER — LEVOTHYROXINE SODIUM 25 MCG PO TABS: 25.0000 ug | ORAL_TABLET | Freq: Every day | ORAL | 0 refills | Status: DC

## 2016-07-23 NOTE — Telephone Encounter (Signed)
Annual due in Oct.

## 2016-10-09 ENCOUNTER — Ambulatory Visit (INDEPENDENT_AMBULATORY_CARE_PROVIDER_SITE_OTHER): Payer: 59 | Admitting: Women's Health

## 2016-10-09 ENCOUNTER — Encounter: Payer: Self-pay | Admitting: Women's Health

## 2016-10-09 VITALS — BP 124/80 | Ht 61.0 in | Wt 239.0 lb

## 2016-10-09 DIAGNOSIS — E038 Other specified hypothyroidism: Secondary | ICD-10-CM

## 2016-10-09 DIAGNOSIS — Z113 Encounter for screening for infections with a predominantly sexual mode of transmission: Secondary | ICD-10-CM

## 2016-10-09 DIAGNOSIS — Z01419 Encounter for gynecological examination (general) (routine) without abnormal findings: Secondary | ICD-10-CM | POA: Diagnosis not present

## 2016-10-09 DIAGNOSIS — Z13220 Encounter for screening for lipoid disorders: Secondary | ICD-10-CM | POA: Diagnosis not present

## 2016-10-09 DIAGNOSIS — N912 Amenorrhea, unspecified: Secondary | ICD-10-CM

## 2016-10-09 DIAGNOSIS — Z30011 Encounter for initial prescription of contraceptive pills: Secondary | ICD-10-CM

## 2016-10-09 LAB — TSH: TSH: 1.41 m[IU]/L

## 2016-10-09 MED ORDER — LEVOTHYROXINE SODIUM 25 MCG PO TABS: 25.0000 ug | ORAL_TABLET | Freq: Every day | ORAL | 4 refills | Status: DC

## 2016-10-09 MED ORDER — NORETHINDRONE 0.35 MG PO TABS: 1.0000 | ORAL_TABLET | Freq: Every day | ORAL | 4 refills | Status: DC

## 2016-10-09 NOTE — Progress Notes (Addendum)
ANJALEE NETTO 1986/04/12 [id number]    History:    Presents for annual exam.  Light monthly cycle on Micronor. Light monthly cycles started when placed on Synthroid. 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 Gardasil series completed. Started on lisinopril this past year per primary care. PCOS. Normal Pap history. Same partner/requests STD screen. Not sure if desires children.  Past medical history, past surgical history, family history and social history were all reviewed and documented in the EPIC chart. 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 Sister and mother diabetes, mother heart disease.  ROS:  A ROS was performed and pertinent positives and negatives are included.  Exam:  Vitals:   10/09/16 1443  BP: 124/80  Weight: 239 lb (108.4 kg)  Height: 5\' 1"  (1.549 m)   Body mass index is 45.16 kg/m.   General appearance:  Normal Thyroid:  Symmetrical, normal in size, without palpable masses or nodularity. Respiratory  Auscultation:  Clear without wheezing or rhonchi Cardiovascular  Auscultation:  Regular rate, without rubs, murmurs or gallops  Edema/varicosities:  Not grossly evident Abdominal  Soft,nontender, without masses, guarding or rebound.  Liver/spleen:  No organomegaly noted  Hernia:  None appreciated  Skin  Inspection:  Grossly normal   Breasts: Examined lying and sitting.     Right: Without masses, retractions, discharge or axillary adenopathy.     Left: Without masses, retractions, discharge or axillary adenopathy. Gentitourinary   Inguinal/mons:  Normal without inguinal adenopathy  External genitalia:  Normal  BUS/Urethra/Skene's glands:  Normal  Vagina:  Normal  Cervix:  Normal  Uterus:   normal in size, shape and contour.  Midline and mobile  Adnexa/parametria:     Rt: Without masses or tenderness.   Lt: Without masses or tenderness.  Anus and perineum: Normal  Digital rectal exam: Normal sphincter tone without palpated masses or  tenderness  Assessment/Plan:  30 y.o. SBF G0 for annual exam with no complaints.  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 SBE's, exercise, calcium rich diet, MVI daily encouraged. Reviewed importance of decreasing calories for weight loss. Synthroid 25 g by mouth daily prescription, proper use given and reviewed. CBC, lipid panel, CMP, vitamin D, hemoglobin A1c, UA, GC/Chlamydia, HIV, hep B, C, RPR per patient's request. Pap normal 2016, new screening guidelines reviewed. Instructed to take labs to primary care for continued hypertension monitoring. Counseling encouraged for relationship issues with long-term partner.   Huel Cote [facility], 5:16 PM 10/09/2016

## 2016-10-09 NOTE — Patient Instructions (Signed)
Basic Carbohydrate Counting for Diabetes Mellitus Carbohydrate counting is a method for keeping track of the amount of carbohydrates you eat. Eating carbohydrates naturally increases the level of sugar (glucose) in your blood, so it is important for you to know the amount that is okay for you to have in every meal. Carbohydrate counting helps keep the level of glucose in your blood within normal limits. The amount of carbohydrates allowed is different for every person. A dietitian can help you calculate the amount that is right for you. Once you know the amount of carbohydrates you can have, you can count the carbohydrates in the foods you want to eat. Carbohydrates are found in the following foods:  Grains, such as breads and cereals.  Dried beans and soy products.  Starchy vegetables, such as potatoes, peas, and corn.  Fruit and fruit juices.  Milk and yogurt.  Sweets and snack foods, such as cake, cookies, candy, chips, soft drinks, and fruit drinks. CARBOHYDRATE COUNTING There are two ways to count the carbohydrates in your food. You can use either of the methods or a combination of both. Reading the "Nutrition Facts" on [place] The "Nutrition Facts" is an area that is included on the labels of almost all packaged food and beverages in the [place]. It includes the serving size of that food or beverage and information about the nutrients in each serving of the food, including the grams (g) of carbohydrate per serving.  Decide the number of servings of this food or beverage that you will be able to eat or drink. Multiply that number of servings by the number of grams of carbohydrate that is listed on the label for that serving. The total will be the amount of carbohydrates you will be having when you eat or drink this food or beverage. Learning Standard Serving Sizes of Food When you eat food that is not packaged or does not include "Nutrition Facts" on the label, you need to  measure the servings in order to count the amount of carbohydrates.A serving of most carbohydrate-rich foods contains about 15 g of carbohydrates. The following list includes serving sizes of carbohydrate-rich foods that provide 15 g ofcarbohydrate per serving:   1 slice of bread (1 oz) or 1 six-inch tortilla.    of a hamburger bun or English muffin.  4-6 crackers.   cup unsweetened dry cereal.    cup hot cereal.   cup rice or pasta.    cup mashed potatoes or  of a large baked potato.  1 cup fresh fruit or one small piece of fruit.    cup canned or frozen fruit or fruit juice.  1 cup milk.   cup plain fat-free yogurt or yogurt sweetened with artificial sweeteners.   cup cooked dried beans or starchy vegetable, such as peas, corn, or potatoes.  Decide the number of standard-size servings that you will eat. Multiply that number of servings by 15 (the grams of carbohydrates in that serving). For example, if you eat 2 cups of strawberries, you will have eaten 2 servings and 30 g of carbohydrates (2 servings x 15 g = 30 g). For foods such as soups and casseroles, in which more than one food is mixed in, you will need to count the carbohydrates in each food that is included. EXAMPLE OF CARBOHYDRATE COUNTING Sample Dinner  3 oz chicken breast.   cup of [place] rice.   cup of corn.  1 cup milk.   1 cup strawberries with  sugar-free whipped topping.  Carbohydrate Calculation Step 1: Identify the foods that contain carbohydrates:   Rice.   Corn.   Milk.   Strawberries. Step 2:Calculate the number of servings eaten of each:   2 servings of rice.   1 serving of corn.   1 serving of milk.   1 serving of strawberries. Step 3: Multiply each of those number of servings by 15 g:   2 servings of rice x 15 g = 30 g.   1 serving of corn x 15 g = 15 g.   1 serving of milk x 15 g = 15 g.   1 serving of strawberries x 15 g = 15 g. Step 4: Add  together all of the amounts to find the total grams of carbohydrates eaten: 30 g + 15 g + 15 g + 15 g = 75 g.   This information is not intended to replace advice given to you by your health care provider. Make sure you discuss any questions you have with your health care provider.   Document Released: 12/03/2005 Document Revised: 12/24/2014 Document Reviewed: 10/30/2013 Elsevier Interactive Patient Education 2016 Heath Maintenance, Female Adopting a healthy lifestyle and getting preventive care can go a long way to promote health and wellness. Talk with your health care provider about what schedule of regular examinations is right for you. This is a good chance for you to check in with your provider about disease prevention and staying healthy. In between checkups, there are plenty of things you can do on your own. Experts have done a lot of research about which lifestyle changes and preventive measures are most likely to keep you healthy. Ask your health care provider for more information. WEIGHT AND DIET  Eat a healthy diet  Be sure to include plenty of vegetables, fruits, low-fat dairy products, and lean protein.  Do not eat a lot of foods high in solid fats, added sugars, or salt.  Get regular exercise. This is one of the most important things you can do for your health.  Most adults should exercise for at least 150 minutes each week. The exercise should increase your heart rate and make you sweat (moderate-intensity exercise).  Most adults should also do strengthening exercises at least twice a week. This is in addition to the moderate-intensity exercise.  Maintain a healthy weight  Body mass index (BMI) is a measurement that can be used to identify possible weight problems. It estimates body fat based on height and weight. Your health care provider can help determine your BMI and help you achieve or maintain a healthy weight.  For females 87 years of age and older:    A BMI below 18.5 is considered underweight.  A BMI of 18.5 to 24.9 is normal.  A BMI of 25 to 29.9 is considered overweight.  A BMI of 30 and above is considered obese.  Watch levels of cholesterol and blood lipids  You should start having your blood tested for lipids and cholesterol at 31 years of age, then have this test every 5 years.  You may need to have your cholesterol levels checked more often if:  Your lipid or cholesterol levels are high.  You are older than 31 years of age.  You are at high risk for heart disease.  CANCER SCREENING   Lung Cancer  Lung cancer screening is recommended for adults 12-76 years old who are at high risk for lung cancer because of a history of  smoking.  A yearly low-dose CT scan of the lungs is recommended for people who:  Currently smoke.  Have quit within the past 15 years.  Have at least a 30-pack-year history of smoking. A pack year is smoking an average of one pack of cigarettes a day for 1 year.  Yearly screening should continue until it has been 15 years since you quit.  Yearly screening should stop if you develop a health problem that would prevent you from having lung cancer treatment.  Breast Cancer  Practice breast self-awareness. This means understanding how your breasts normally appear and feel.  It also means doing regular breast self-exams. Let your health care provider know about any changes, no matter how small.  If you are in your 20s or 30s, you should have a clinical breast exam (CBE) by a health care provider every 1-3 years as part of a regular health exam.  If you are 66 or older, have a CBE every year. Also consider having a breast X-ray (mammogram) every year.  If you have a family history of breast cancer, talk to your health care provider about genetic screening.  If you are at high risk for breast cancer, talk to your health care provider about having an MRI and a mammogram every year.  Breast  cancer gene (BRCA) assessment is recommended for women who have family members with BRCA-related cancers. BRCA-related cancers include:  Breast.  Ovarian.  Tubal.  Peritoneal cancers.  Results of the assessment will determine the need for genetic counseling and BRCA1 and BRCA2 testing. Cervical Cancer Your health care provider may recommend that you be screened regularly for cancer of the pelvic organs (ovaries, uterus, and vagina). This screening involves a pelvic examination, including checking for microscopic changes to the surface of your cervix (Pap test). You may be encouraged to have this screening done every 3 years, beginning at age 24.  For women ages 17-65, health care providers may recommend pelvic exams and Pap testing every 3 years, or they may recommend the Pap and pelvic exam, combined with testing for human papilloma virus (HPV), every 5 years. Some types of HPV increase your risk of cervical cancer. Testing for HPV may also be done on women of any age with unclear Pap test results.  Other health care providers may not recommend any screening for nonpregnant women who are considered low risk for pelvic cancer and who do not have symptoms. Ask your health care provider if a screening pelvic exam is right for you.  If you have had past treatment for cervical cancer or a condition that could lead to cancer, you need Pap tests and screening for cancer for at least 20 years after your treatment. If Pap tests have been discontinued, your risk factors (such as having a new sexual partner) need to be reassessed to determine if screening should resume. Some women have medical problems that increase the chance of getting cervical cancer. In these cases, your health care provider may recommend more frequent screening and Pap tests. Colorectal Cancer  This type of cancer can be detected and often prevented.  Routine colorectal cancer screening usually begins at 31 years of age and  continues through 31 years of age.  Your health care provider may recommend screening at an earlier age if you have risk factors for colon cancer.  Your health care provider may also recommend using home test kits to check for hidden blood in the stool.  A small camera at the end  of a tube can be used to examine your colon directly (sigmoidoscopy or colonoscopy). This is done to check for the earliest forms of colorectal cancer.  Routine screening usually begins at age 50.  Direct examination of the colon should be repeated every 5-10 years through 31 years of age. However, you may need to be screened more often if early forms of precancerous polyps or small growths are found. Skin Cancer  Check your skin from head to toe regularly.  Tell your health care provider about any new moles or changes in moles, especially if there is a change in a mole's shape or color.  Also tell your health care provider if you have a mole that is larger than the size of a pencil eraser.  Always use sunscreen. Apply sunscreen liberally and repeatedly throughout the day.  Protect yourself by wearing long sleeves, pants, a wide-brimmed hat, and sunglasses whenever you are outside. HEART DISEASE, DIABETES, AND HIGH BLOOD PRESSURE   High blood pressure causes heart disease and increases the risk of stroke. High blood pressure is more likely to develop in:  People who have blood pressure in the high end of the normal range (130-139/85-89 mm Hg).  People who are overweight or obese.  People who are African American.  If you are 18-39 years of age, have your blood pressure checked every 3-5 years. If you are 40 years of age or older, have your blood pressure checked every year. You should have your blood pressure measured twice--once when you are at a hospital or clinic, and once when you are not at a hospital or clinic. Record the average of the two measurements. To check your blood pressure when you are not at  a hospital or clinic, you can use:  An automated blood pressure machine at a pharmacy.  A home blood pressure monitor.  If you are between 55 years and 79 years old, ask your health care provider if you should take aspirin to prevent strokes.  Have regular diabetes screenings. This involves taking a blood sample to check your fasting blood sugar level.  If you are at a normal weight and have a low risk for diabetes, have this test once every three years after 31 years of age.  If you are overweight and have a high risk for diabetes, consider being tested at a younger age or more often. PREVENTING INFECTION  Hepatitis B  If you have a higher risk for hepatitis B, you should be screened for this virus. You are considered at high risk for hepatitis B if:  You were born in a country where hepatitis B is common. Ask your health care provider which countries are considered high risk.  Your parents were born in a high-risk country, and you have not been immunized against hepatitis B (hepatitis B vaccine).  You have HIV or AIDS.  You use needles to inject street drugs.  You live with someone who has hepatitis B.  You have had sex with someone who has hepatitis B.  You get hemodialysis treatment.  You take certain medicines for conditions, including cancer, organ transplantation, and autoimmune conditions. Hepatitis C  Blood testing is recommended for:  Everyone born from 1945 through 1965.  Anyone with known risk factors for hepatitis C. Sexually transmitted infections (STIs)  You should be screened for sexually transmitted infections (STIs) including gonorrhea and chlamydia if:  You are sexually active and are younger than 31 years of age.  You are older than 31   years of age and your health care provider tells you that you are at risk for this type of infection.  Your sexual activity has changed since you were last screened and you are at an increased risk for chlamydia or  gonorrhea. Ask your health care provider if you are at risk.  If you do not have HIV, but are at risk, it may be recommended that you take a prescription medicine daily to prevent HIV infection. This is called pre-exposure prophylaxis (PrEP). You are considered at risk if:  You are sexually active and do not regularly use condoms or know the HIV status of your partner(s).  You take drugs by injection.  You are sexually active with a partner who has HIV. Talk with your health care provider about whether you are at high risk of being infected with HIV. If you choose to begin PrEP, you should first be tested for HIV. You should then be tested every 3 months for as long as you are taking PrEP.  PREGNANCY   If you are premenopausal and you may become pregnant, ask your health care provider about preconception counseling.  If you may become pregnant, take 400 to 800 micrograms (mcg) of folic acid every day.  If you want to prevent pregnancy, talk to your health care provider about birth control (contraception). OSTEOPOROSIS AND MENOPAUSE   Osteoporosis is a disease in which the bones lose minerals and strength with aging. This can result in serious bone fractures. Your risk for osteoporosis can be identified using a bone density scan.  If you are 16 years of age or older, or if you are at risk for osteoporosis and fractures, ask your health care provider if you should be screened.  Ask your health care provider whether you should take a calcium or vitamin D supplement to lower your risk for osteoporosis.  Menopause may have certain physical symptoms and risks.  Hormone replacement therapy may reduce some of these symptoms and risks. Talk to your health care provider about whether hormone replacement therapy is right for you.  HOME CARE INSTRUCTIONS   Schedule regular health, dental, and eye exams.  Stay current with your immunizations.   Do not use any tobacco products including  cigarettes, chewing tobacco, or electronic cigarettes.  If you are pregnant, do not drink alcohol.  If you are breastfeeding, limit how much and how often you drink alcohol.  Limit alcohol intake to no more than 1 drink per day for nonpregnant women. One drink equals 12 ounces of beer, 5 ounces of wine, or 1 ounces of hard liquor.  Do not use street drugs.  Do not share needles.  Ask your health care provider for help if you need support or information about quitting drugs.  Tell your health care provider if you often feel depressed.  Tell your health care provider if you have ever been abused or do not feel safe at home.   This information is not intended to replace advice given to you by your health care provider. Make sure you discuss any questions you have with your health care provider.   Document Released: 06/18/2011 Document Revised: 12/24/2014 Document Reviewed: 11/04/2013 Elsevier Interactive Patient Education [employer].

## 2016-10-10 ENCOUNTER — Other Ambulatory Visit: Payer: Self-pay | Admitting: Women's Health

## 2016-10-10 LAB — RPR

## 2016-10-10 LAB — URINALYSIS W MICROSCOPIC + REFLEX CULTURE
BACTERIA UA: NONE SEEN [HPF]
Bilirubin Urine: NEGATIVE
CASTS: NONE SEEN [LPF]
CRYSTALS: NONE SEEN [HPF]
Glucose, UA: NEGATIVE
Ketones, ur: NEGATIVE
Leukocytes, UA: NEGATIVE
NITRITE: NEGATIVE
PH: 7.5 (ref 5.0–8.0)
PROTEIN: NEGATIVE
Specific Gravity, Urine: 1.024 (ref 1.001–1.035)
YEAST: NONE SEEN [HPF]

## 2016-10-10 LAB — LIPID PANEL
CHOL/HDL RATIO: 2.9 ratio (ref <=5.0)
Cholesterol: 100 mg/dL — ABNORMAL LOW (ref 125–200)
HDL: 34 mg/dL — ABNORMAL LOW (ref >=46)
LDL Cholesterol: 29 mg/dL (ref <130)
Triglycerides: 186 mg/dL — ABNORMAL HIGH (ref <150)
VLDL: 37 mg/dL — AB (ref <30)

## 2016-10-10 LAB — VITAMIN D 25 HYDROXY (VIT D DEFICIENCY, FRACTURES): VIT D 25 HYDROXY: 20 ng/mL — AB (ref 30–100)

## 2016-10-10 LAB — COMPREHENSIVE METABOLIC PANEL
ALK PHOS: 71 U/L (ref 33–115)
ALT: 35 U/L — AB (ref 6–29)
AST: 24 U/L (ref 10–30)
Albumin: 4.1 g/dL (ref 3.6–5.1)
BUN: 15 mg/dL (ref 7–25)
CHLORIDE: 107 mmol/L (ref 98–110)
CO2: 20 mmol/L (ref 20–31)
Calcium: 9.2 mg/dL (ref 8.6–10.2)
Creat: 1.06 mg/dL (ref 0.50–1.10)
GLUCOSE: 100 mg/dL — AB (ref 65–99)
POTASSIUM: 3.9 mmol/L (ref 3.5–5.3)
Sodium: 140 mmol/L (ref 135–146)
Total Bilirubin: 0.3 mg/dL (ref 0.2–1.2)
Total Protein: 6.9 g/dL (ref 6.1–8.1)

## 2016-10-10 LAB — HEPATITIS C ANTIBODY: HCV Ab: NEGATIVE

## 2016-10-10 LAB — HEMOGLOBIN A1C
Hgb A1c MFr Bld: 5.4 % (ref <5.7)
Mean Plasma Glucose: 108 mg/dL

## 2016-10-10 LAB — HIV ANTIBODY (ROUTINE TESTING W REFLEX): HIV 1&2 Ab, 4th Generation: NONREACTIVE

## 2016-10-10 LAB — GC/CHLAMYDIA PROBE AMP
CT Probe RNA: NOT DETECTED
GC Probe RNA: NOT DETECTED

## 2016-10-10 LAB — HEPATITIS B SURFACE ANTIGEN: Hepatitis B Surface Ag: NEGATIVE

## 2016-10-10 MED ORDER — VITAMIN D (ERGOCALCIFEROL) 1.25 MG (50000 UNIT) PO CAPS: 50000.0000 [IU] | ORAL_CAPSULE | ORAL | 0 refills | Status: DC

## 2016-10-11 LAB — URINE CULTURE

## 2016-10-12 MED FILL — VIT D2 1.25 MG (50,000 UNIT: 1.25 MG | 84 days supply | Qty: 12 | Fill #0

## 2016-10-23 MED FILL — LISINOPRIL 10 MG TABLET: 10 | 90 days supply | Qty: 90 | Fill #0

## 2016-10-26 MED FILL — LEVOTHYROXINE 25 MCG TABLET: 25 | 90 days supply | Qty: 90 | Fill #0

## 2016-11-17 ENCOUNTER — Other Ambulatory Visit: Payer: Self-pay | Admitting: Women's Health

## 2016-11-17 DIAGNOSIS — N912 Amenorrhea, unspecified: Secondary | ICD-10-CM

## 2016-11-17 DIAGNOSIS — Z30011 Encounter for initial prescription of contraceptive pills: Secondary | ICD-10-CM

## 2016-11-23 MED FILL — NORETHINDRONE 0.35 MG TAB: 0.35 | 84 days supply | Qty: 84 | Fill #0

## 2017-01-10 DIAGNOSIS — Z0000 Encounter for general adult medical examination without abnormal findings: Secondary | ICD-10-CM | POA: Diagnosis not present

## 2017-01-10 DIAGNOSIS — I10 Essential (primary) hypertension: Secondary | ICD-10-CM | POA: Diagnosis not present

## 2017-01-10 DIAGNOSIS — E7439 Other disorders of intestinal carbohydrate absorption: Secondary | ICD-10-CM | POA: Diagnosis not present

## 2017-01-10 DIAGNOSIS — E079 Disorder of thyroid, unspecified: Secondary | ICD-10-CM | POA: Diagnosis not present

## 2017-01-10 DIAGNOSIS — Z6841 Body Mass Index (BMI) 40.0 and over, adult: Secondary | ICD-10-CM | POA: Diagnosis not present

## 2017-01-28 MED FILL — LISINOPRIL 10 MG TABLET: 10 | 90 days supply | Qty: 90 | Fill #1

## 2017-01-28 MED FILL — LEVOTHYROXINE 25 MCG TABLET: 25 | 90 days supply | Qty: 90 | Fill #1

## 2017-02-20 MED FILL — NORETHINDRONE 0.35 MG TAB: 0.35 | 84 days supply | Qty: 84 | Fill #1

## 2017-03-07 ENCOUNTER — Encounter: Payer: Self-pay | Admitting: Women's Health

## 2017-03-29 DIAGNOSIS — H52223 Regular astigmatism, bilateral: Secondary | ICD-10-CM | POA: Diagnosis not present

## 2017-03-29 DIAGNOSIS — H5213 Myopia, bilateral: Secondary | ICD-10-CM | POA: Diagnosis not present

## 2017-04-29 MED FILL — LEVOTHYROXINE 25 MCG TABLET: 25 | 90 days supply | Qty: 90 | Fill #2

## 2017-04-30 MED FILL — LISINOPRIL 10 MG TABLET: 10 | 90 days supply | Qty: 90 | Fill #0

## 2017-05-01 ENCOUNTER — Encounter: Payer: Self-pay | Admitting: Gynecology

## 2017-05-06 ENCOUNTER — Encounter: Payer: Self-pay | Admitting: Women's Health

## 2017-05-06 ENCOUNTER — Ambulatory Visit (INDEPENDENT_AMBULATORY_CARE_PROVIDER_SITE_OTHER): Payer: 59 | Admitting: Women's Health

## 2017-05-06 VITALS — BP 126/82

## 2017-05-06 DIAGNOSIS — N898 Other specified noninflammatory disorders of vagina: Secondary | ICD-10-CM

## 2017-05-06 DIAGNOSIS — R350 Frequency of micturition: Secondary | ICD-10-CM | POA: Diagnosis not present

## 2017-05-06 DIAGNOSIS — N760 Acute vaginitis: Secondary | ICD-10-CM | POA: Diagnosis not present

## 2017-05-06 DIAGNOSIS — B9689 Other specified bacterial agents as the cause of diseases classified elsewhere: Secondary | ICD-10-CM

## 2017-05-06 LAB — URINALYSIS W MICROSCOPIC + REFLEX CULTURE
Bilirubin Urine: NEGATIVE
CASTS: NONE SEEN [LPF]
CRYSTALS: NONE SEEN [HPF]
Glucose, UA: NEGATIVE
HGB URINE DIPSTICK: NEGATIVE
KETONES UR: NEGATIVE
Nitrite: NEGATIVE
PH: 5 (ref 5.0–8.0)
PROTEIN: NEGATIVE
RBC / HPF: NONE SEEN RBC/HPF (ref <=2)
Specific Gravity, Urine: 1.025 (ref 1.001–1.035)
Yeast: NONE SEEN [HPF]

## 2017-05-06 LAB — WET PREP FOR TRICH, YEAST, CLUE
Trich, Wet Prep: NONE SEEN
Yeast Wet Prep HPF POC: NONE SEEN

## 2017-05-06 MED ORDER — METRONIDAZOLE 500 MG PO TABS: 500.0000 mg | ORAL_TABLET | Freq: Two times a day (BID) | ORAL | 0 refills | Status: DC

## 2017-05-06 MED FILL — metroNIDAZOLE 500 MG TABS: 500 | 7 days supply | Qty: 14 | Fill #0

## 2017-05-06 NOTE — Patient Instructions (Signed)
Bacterial Vaginosis Bacterial vaginosis is a vaginal infection that occurs when the normal balance of bacteria in the vagina is disrupted. It results from an overgrowth of certain bacteria. This is the most common vaginal infection among women ages 15-44. Because bacterial vaginosis increases your risk for STIs (sexually transmitted infections), getting treated can help reduce your risk for chlamydia, gonorrhea, herpes, and HIV (human immunodeficiency virus). Treatment is also important for preventing complications in pregnant women, because this condition can cause an early (premature) delivery. What are the causes? This condition is caused by an increase in harmful bacteria that are normally present in small amounts in the vagina. However, the reason that the condition develops is not fully understood. What increases the risk? The following factors may make you more likely to develop this condition:  Having a new sexual partner or multiple sexual partners.  Having unprotected sex.  Douching.  Having an intrauterine device (IUD).  Smoking.  Drug and alcohol abuse.  Taking certain antibiotic medicines.  Being pregnant.  You cannot get bacterial vaginosis from toilet seats, bedding, swimming pools, or contact with objects around you. What are the signs or symptoms? Symptoms of this condition include:  Grey or white vaginal discharge. The discharge can also be watery or foamy.  A fish-like odor with discharge, especially after sexual intercourse or during menstruation.  Itching in and around the vagina.  Burning or pain with urination.  Some women with bacterial vaginosis have no signs or symptoms. How is this diagnosed? This condition is diagnosed based on:  Your medical history.  A physical exam of the vagina.  Testing a sample of vaginal fluid under a microscope to look for a large amount of bad bacteria or abnormal cells. Your health care provider may use a cotton swab  or a small wooden spatula to collect the sample.  How is this treated? This condition is treated with antibiotics. These may be given as a pill, a vaginal cream, or a medicine that is put into the vagina (suppository). If the condition comes back after treatment, a second round of antibiotics may be needed. Follow these instructions at home: Medicines  Take over-the-counter and prescription medicines only as told by your health care provider.  Take or use your antibiotic as told by your health care provider. Do not stop taking or using the antibiotic even if you start to feel better. General instructions  If you have a female sexual partner, tell her that you have a vaginal infection. She should see her health care provider and be treated if she has symptoms. If you have a female sexual partner, he does not need treatment.  During treatment: ? Avoid sexual activity until you finish treatment. ? Do not douche. ? Avoid alcohol as directed by your health care provider. ? Avoid breastfeeding as directed by your health care provider.  Drink enough water and fluids to keep your urine clear or pale yellow.  Keep the area around your vagina and rectum clean. ? Wash the area daily with warm water. ? Wipe yourself from front to back after using the toilet.  Keep all follow-up visits as told by your health care provider. This is important. How is this prevented?  Do not douche.  Wash the outside of your vagina with warm water only.  Use protection when having sex. This includes latex condoms and dental dams.  Limit how many sexual partners you have. To help prevent bacterial vaginosis, it is best to have sex with just   one partner (monogamous).  Make sure you and your sexual partner are tested for STIs.  Wear cotton or cotton-lined underwear.  Avoid wearing tight pants and pantyhose, especially during summer.  Limit the amount of alcohol that you drink.  Do not use any products that  contain nicotine or tobacco, such as cigarettes and e-cigarettes. If you need help quitting, ask your health care provider.  Do not use illegal drugs. Where to find more information:  Centers for Disease Control and Prevention: www.cdc.gov/std  American Sexual Health Association (ASHA): www.ashastd.org  [place] Department of Health and Human Services, Office on Women's Health: www.womenshealth.gov/ or https://www.womenshealth.gov/a-z-topics/bacterial-vaginosis Contact a health care provider if:  Your symptoms do not improve, even after treatment.  You have more discharge or pain when urinating.  You have a fever.  You have pain in your abdomen.  You have pain during sex.  You have vaginal bleeding between periods. Summary  Bacterial vaginosis is a vaginal infection that occurs when the normal balance of bacteria in the vagina is disrupted.  Because bacterial vaginosis increases your risk for STIs (sexually transmitted infections), getting treated can help reduce your risk for chlamydia, gonorrhea, herpes, and HIV (human immunodeficiency virus). Treatment is also important for preventing complications in pregnant women, because the condition can cause an early (premature) delivery.  This condition is treated with antibiotic medicines. These may be given as a pill, a vaginal cream, or a medicine that is put into the vagina (suppository). This information is not intended to replace advice given to you by your health care provider. Make sure you discuss any questions you have with your health care provider. Document Released: 12/03/2005 Document Revised: 08/18/2016 Document Reviewed: 08/18/2016 Elsevier Interactive Patient Education  2017 [employer]  

## 2017-05-06 NOTE — Progress Notes (Signed)
Presents with complaint of scant vaginal discharge with irritation, mild itching, no odor. Also having bladder pressure without pain or burning. Mild urinary frequency without pain at end of stream. Denies fever. Monthly cycle on Micronor. New partner who had a negative STD screen prior to intercourse. History of a DVT 2003, hypertension and hypothyroid. Beginning to think about pregnancy.  Exam: Appears well, obese. External genitalia erythematous at introitus, speculum exam scant discharge wet prep positive for moderate clues, TNTC bacteria. GC/Chlamydia culture taken. Bimanual no CMT or adnexal tenderness. UA: +1 leukocytes, 10-20 WBCs, 6-10 squamous epithelials, many bacteria, clue cells present.  Bacteria vaginosis STD screen Bladder pressure  Plan: Urine culture pending will await culture results. Flagyl 500 twice daily for 7 days, alcohol precautions reviewed. Instructed to call if no relief of discharge. Encouraged to follow-up with primary care for blood pressure medication change prior to trying to conceive. GC/Chlamydia culture pending, denies need for HIV, hepatitis or RPR.

## 2017-05-07 LAB — GC/CHLAMYDIA PROBE AMP
CT Probe RNA: NOT DETECTED
GC PROBE AMP APTIMA: NOT DETECTED

## 2017-05-07 LAB — URINE CULTURE

## 2017-05-08 ENCOUNTER — Encounter: Payer: Self-pay | Admitting: Women's Health

## 2017-05-16 MED FILL — NORETHINDRONE 0.35 MG TAB: 0.35 | 84 days supply | Qty: 84 | Fill #2

## 2017-05-29 DIAGNOSIS — J020 Streptococcal pharyngitis: Secondary | ICD-10-CM | POA: Diagnosis not present

## 2017-05-29 MED FILL — AMOXICILLIN 875 MG TABLET: 875 | 1 days supply | Qty: 20 | Fill #0

## 2017-07-15 DIAGNOSIS — I10 Essential (primary) hypertension: Secondary | ICD-10-CM | POA: Diagnosis not present

## 2017-07-15 DIAGNOSIS — E7439 Other disorders of intestinal carbohydrate absorption: Secondary | ICD-10-CM | POA: Diagnosis not present

## 2017-08-07 MED FILL — NORETHINDRONE 0.35 MG TAB: 0.35 | 84 days supply | Qty: 84 | Fill #3

## 2017-08-07 MED FILL — LEVOTHYROXINE 25 MCG TABLET: 25 | 90 days supply | Qty: 90 | Fill #3

## 2017-08-07 MED FILL — LISINOPRIL 10 MG TABLET: 10 | 90 days supply | Qty: 90 | Fill #1

## 2017-11-01 ENCOUNTER — Other Ambulatory Visit: Payer: Self-pay | Admitting: Women's Health

## 2017-11-01 DIAGNOSIS — Z30011 Encounter for initial prescription of contraceptive pills: Secondary | ICD-10-CM

## 2017-11-01 DIAGNOSIS — N912 Amenorrhea, unspecified: Secondary | ICD-10-CM

## 2017-11-01 MED FILL — NORETHINDRONE 0.35 MG TAB: 0.35 | 84 days supply | Qty: 84 | Fill #0

## 2017-11-05 MED FILL — LISINOPRIL 10 MG TABS: 10 | 90 days supply | Qty: 90 | Fill #2

## 2017-11-06 ENCOUNTER — Ambulatory Visit (INDEPENDENT_AMBULATORY_CARE_PROVIDER_SITE_OTHER): Payer: 59 | Admitting: Women's Health

## 2017-11-06 ENCOUNTER — Encounter: Payer: Self-pay | Admitting: Women's Health

## 2017-11-06 VITALS — BP 122/80 | Ht 61.0 in | Wt 235.0 lb

## 2017-11-06 DIAGNOSIS — Z113 Encounter for screening for infections with a predominantly sexual mode of transmission: Secondary | ICD-10-CM

## 2017-11-06 DIAGNOSIS — N898 Other specified noninflammatory disorders of vagina: Secondary | ICD-10-CM | POA: Diagnosis not present

## 2017-11-06 DIAGNOSIS — I10 Essential (primary) hypertension: Secondary | ICD-10-CM

## 2017-11-06 DIAGNOSIS — B9689 Other specified bacterial agents as the cause of diseases classified elsewhere: Secondary | ICD-10-CM | POA: Diagnosis not present

## 2017-11-06 DIAGNOSIS — E038 Other specified hypothyroidism: Secondary | ICD-10-CM

## 2017-11-06 DIAGNOSIS — Z01419 Encounter for gynecological examination (general) (routine) without abnormal findings: Secondary | ICD-10-CM

## 2017-11-06 DIAGNOSIS — N912 Amenorrhea, unspecified: Secondary | ICD-10-CM | POA: Diagnosis not present

## 2017-11-06 DIAGNOSIS — N760 Acute vaginitis: Secondary | ICD-10-CM

## 2017-11-06 DIAGNOSIS — E039 Hypothyroidism, unspecified: Secondary | ICD-10-CM | POA: Insufficient documentation

## 2017-11-06 DIAGNOSIS — Z30011 Encounter for initial prescription of contraceptive pills: Secondary | ICD-10-CM

## 2017-11-06 LAB — WET PREP FOR TRICH, YEAST, CLUE

## 2017-11-06 MED ORDER — METRONIDAZOLE 500 MG PO TABS: 500.0000 mg | ORAL_TABLET | Freq: Two times a day (BID) | ORAL | 0 refills | Status: DC

## 2017-11-06 MED ORDER — NORETHINDRONE 0.35 MG PO TABS: 1.0000 | ORAL_TABLET | Freq: Every day | ORAL | 4 refills | Status: DC

## 2017-11-06 MED ORDER — LEVOTHYROXINE SODIUM 25 MCG PO TABS: 25.0000 ug | ORAL_TABLET | Freq: Every day | ORAL | 4 refills | Status: DC

## 2017-11-06 MED FILL — LEVOTHYROXINE 25 MCG TABLET: 25 | 90 days supply | Qty: 90 | Fill #0

## 2017-11-06 MED FILL — metroNIDAZOLE 500 MG TABS: 500 | 7 days supply | Qty: 14 | Fill #0

## 2017-11-06 NOTE — Addendum Note (Signed)
Addended by: Alen Blew on: 11/06/2017 12:09 PM   Modules accepted: Orders

## 2017-11-06 NOTE — Progress Notes (Signed)
Kathryn Buck Oct 02, 1986 [id number]    History:    Presents for annual exam.  Monthly cycle on Micronor. History of PCOS with amenorrhea. Normal Pap history. Hypothyroid and hypertension managed by primary care. Not sexually active, requests STD screen, no known exposure.  Past medical history, past surgical history, family history and social history were all reviewed and documented in the EPIC chart. 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 2014 dermoid. 2003 DVT while on Ortho Evra patch.  ROS:  A ROS was performed and pertinent positives and negatives are included.  Exam:  Vitals:   11/06/17 1119  BP: 122/80  Weight: 235 lb (106.6 kg)  Height: 5\' 1"  (1.549 m)   Body mass index is 44.4 kg/m.   General appearance:  Normal Thyroid:  Symmetrical, normal in size, without palpable masses or nodularity. Respiratory  Auscultation:  Clear without wheezing or rhonchi Cardiovascular  Auscultation:  Regular rate, without rubs, murmurs or gallops  Edema/varicosities:  Not grossly evident Abdominal  Soft,nontender, without masses, guarding or rebound.  Liver/spleen:  No organomegaly noted  Hernia:  None appreciated  Skin  Inspection:  Grossly normal   Breasts: Examined lying and sitting.     Right: Without masses, retractions, discharge or axillary adenopathy.     Left: Without masses, retractions, discharge or axillary adenopathy. Gentitourinary   Inguinal/mons:  Normal without inguinal adenopathy  External genitalia:  Normal  BUS/Urethra/Skene's glands:  Normal  Vagina:  Normal  Cervix:  Normal  Uterus:   normal in size, shape and contour.  Midline and mobile  Adnexa/parametria:     Rt: Without masses or tenderness.   Lt: Without masses or tenderness.  Anus and perineum: Normal  Digital rectal exam: Normal sphincter tone without palpated masses or tenderness  Assessment/Plan:  31 y.o. SBF G0 for annual exam with no complaints.  Light monthly cycle on  Micronor Bacteria vaginosis Hypothyroid and hypertension-primary care manages labs and meds Obesity STD screen  Plan: Micronor prescription, proper use, slight risk for blood clots and strokes reviewed. Aware no placebo week continues to have light monthly cycle. SBEs, continue exercise, currently working with a trainer twice weekly, calcium rich foods, MVI daily encouraged. Reviewed importance of decreasing calories/carbs for weight loss. Flagyl 500 twice daily prescription, proper use, alcohol precautions reviewed. Pap normal 2016, new screening guidelines reviewed. GC/Chlamydia, HIV, hep B, C, RPR.    [place], 11:45 AM 11/06/2017

## 2017-11-06 NOTE — Patient Instructions (Signed)
Health Maintenance, Female Adopting a healthy lifestyle and getting preventive care can go a long way to promote health and wellness. Talk with your health care provider about what schedule of regular examinations is right for you. This is a good chance for you to check in with your provider about disease prevention and staying healthy. In between checkups, there are plenty of things you can do on your own. Experts have done a lot of research about which lifestyle changes and preventive measures are most likely to keep you healthy. Ask your health care provider for more information. Weight and diet Eat a healthy diet  Be sure to include plenty of vegetables, fruits, low-fat dairy products, and lean protein.  Do not eat a lot of foods high in solid fats, added sugars, or salt.  Get regular exercise. This is one of the most important things you can do for your health. ? Most adults should exercise for at least 150 minutes each week. The exercise should increase your heart rate and make you sweat (moderate-intensity exercise). ? Most adults should also do strengthening exercises at least twice a week. This is in addition to the moderate-intensity exercise.  Maintain a healthy weight  Body mass index (BMI) is a measurement that can be used to identify possible weight problems. It estimates body fat based on height and weight. Your health care provider can help determine your BMI and help you achieve or maintain a healthy weight.  For females 20 years of age and older: ? A BMI below 18.5 is considered underweight. ? A BMI of 18.5 to 24.9 is normal. ? A BMI of 25 to 29.9 is considered overweight. ? A BMI of 30 and above is considered obese.  Watch levels of cholesterol and blood lipids  You should start having your blood tested for lipids and cholesterol at 32 years of age, then have this test every 5 years.  You may need to have your cholesterol levels checked more often if: ? Your lipid or  cholesterol levels are high. ? You are older than 32 years of age. ? You are at high risk for heart disease.  Cancer screening Lung Cancer  Lung cancer screening is recommended for adults 55-80 years old who are at high risk for lung cancer because of a history of smoking.  A yearly low-dose CT scan of the lungs is recommended for people who: ? Currently smoke. ? Have quit within the past 15 years. ? Have at least a 30-pack-year history of smoking. A pack year is smoking an average of one pack of cigarettes a day for 1 year.  Yearly screening should continue until it has been 15 years since you quit.  Yearly screening should stop if you develop a health problem that would prevent you from having lung cancer treatment.  Breast Cancer  Practice breast self-awareness. This means understanding how your breasts normally appear and feel.  It also means doing regular breast self-exams. Let your health care provider know about any changes, no matter how small.  If you are in your 20s or 30s, you should have a clinical breast exam (CBE) by a health care provider every 1-3 years as part of a regular health exam.  If you are 40 or older, have a CBE every year. Also consider having a breast X-ray (mammogram) every year.  If you have a family history of breast cancer, talk to your health care provider about genetic screening.  If you are at high risk   for breast cancer, talk to your health care provider about having an MRI and a mammogram every year.  Breast cancer gene (BRCA) assessment is recommended for women who have family members with BRCA-related cancers. BRCA-related cancers include: ? Breast. ? Ovarian. ? Tubal. ? Peritoneal cancers.  Results of the assessment will determine the need for genetic counseling and BRCA1 and BRCA2 testing.  Cervical Cancer Your health care provider may recommend that you be screened regularly for cancer of the pelvic organs (ovaries, uterus, and  vagina). This screening involves a pelvic examination, including checking for microscopic changes to the surface of your cervix (Pap test). You may be encouraged to have this screening done every 3 years, beginning at age 22.  For women ages 56-65, health care providers may recommend pelvic exams and Pap testing every 3 years, or they may recommend the Pap and pelvic exam, combined with testing for human papilloma virus (HPV), every 5 years. Some types of HPV increase your risk of cervical cancer. Testing for HPV may also be done on women of any age with unclear Pap test results.  Other health care providers may not recommend any screening for nonpregnant women who are considered low risk for pelvic cancer and who do not have symptoms. Ask your health care provider if a screening pelvic exam is right for you.  If you have had past treatment for cervical cancer or a condition that could lead to cancer, you need Pap tests and screening for cancer for at least 20 years after your treatment. If Pap tests have been discontinued, your risk factors (such as having a new sexual partner) need to be reassessed to determine if screening should resume. Some women have medical problems that increase the chance of getting cervical cancer. In these cases, your health care provider may recommend more frequent screening and Pap tests.  Colorectal Cancer  This type of cancer can be detected and often prevented.  Routine colorectal cancer screening usually begins at 32 years of age and continues through 32 years of age.  Your health care provider may recommend screening at an earlier age if you have risk factors for colon cancer.  Your health care provider may also recommend using home test kits to check for hidden blood in the stool.  A small camera at the end of a tube can be used to examine your colon directly (sigmoidoscopy or colonoscopy). This is done to check for the earliest forms of colorectal  cancer.  Routine screening usually begins at age 33.  Direct examination of the colon should be repeated every 5-10 years through 32 years of age. However, you may need to be screened more often if early forms of precancerous polyps or small growths are found.  Skin Cancer  Check your skin from head to toe regularly.  Tell your health care provider about any new moles or changes in moles, especially if there is a change in a mole's shape or color.  Also tell your health care provider if you have a mole that is larger than the size of a pencil eraser.  Always use sunscreen. Apply sunscreen liberally and repeatedly throughout the day.  Protect yourself by wearing long sleeves, pants, a wide-brimmed hat, and sunglasses whenever you are outside.  Heart disease, diabetes, and high blood pressure  High blood pressure causes heart disease and increases the risk of stroke. High blood pressure is more likely to develop in: ? People who have blood pressure in the high end of  the normal range (130-139/85-89 mm Hg). ? People who are overweight or obese. ? People who are African American.  If you are 21-29 years of age, have your blood pressure checked every 3-5 years. If you are 3 years of age or older, have your blood pressure checked every year. You should have your blood pressure measured twice-once when you are at a hospital or clinic, and once when you are not at a hospital or clinic. Record the average of the two measurements. To check your blood pressure when you are not at a hospital or clinic, you can use: ? An automated blood pressure machine at a pharmacy. ? A home blood pressure monitor.  If you are between 17 years and 37 years old, ask your health care provider if you should take aspirin to prevent strokes.  Have regular diabetes screenings. This involves taking a blood sample to check your fasting blood sugar level. ? If you are at a normal weight and have a low risk for diabetes,  have this test once every three years after 32 years of age. ? If you are overweight and have a high risk for diabetes, consider being tested at a younger age or more often. Preventing infection Hepatitis B  If you have a higher risk for hepatitis B, you should be screened for this virus. You are considered at high risk for hepatitis B if: ? You were born in a country where hepatitis B is common. Ask your health care provider which countries are considered high risk. ? Your parents were born in a high-risk country, and you have not been immunized against hepatitis B (hepatitis B vaccine). ? You have HIV or AIDS. ? You use needles to inject street drugs. ? You live with someone who has hepatitis B. ? You have had sex with someone who has hepatitis B. ? You get hemodialysis treatment. ? You take certain medicines for conditions, including cancer, organ transplantation, and autoimmune conditions.  Hepatitis C  Blood testing is recommended for: ? Everyone born from [birth year] through 1966. ? Anyone with known risk factors for hepatitis C.  Sexually transmitted infections (STIs)  You should be screened for sexually transmitted infections (STIs) including gonorrhea and chlamydia if: ? You are sexually active and are younger than 32 years of age. ? You are older than 32 years of age and your health care provider tells you that you are at risk for this type of infection. ? Your sexual activity has changed since you were last screened and you are at an increased risk for chlamydia or gonorrhea. Ask your health care provider if you are at risk.  If you do not have HIV, but are at risk, it may be recommended that you take a prescription medicine daily to prevent HIV infection. This is called pre-exposure prophylaxis (PrEP). You are considered at risk if: ? You are sexually active and do not regularly use condoms or know the HIV status of your partner(s). ? You take drugs by injection. ? You are  sexually active with a partner who has HIV.  Talk with your health care provider about whether you are at high risk of being infected with HIV. If you choose to begin PrEP, you should first be tested for HIV. You should then be tested every 3 months for as long as you are taking PrEP. Pregnancy  If you are premenopausal and you may become pregnant, ask your health care provider about preconception counseling.  If you may become  pregnant, take 400 to 800 micrograms (mcg) of folic acid every day.  If you want to prevent pregnancy, talk to your health care provider about birth control (contraception). Osteoporosis and menopause  Osteoporosis is a disease in which the bones lose minerals and strength with aging. This can result in serious bone fractures. Your risk for osteoporosis can be identified using a bone density scan.  If you are 65 years of age or older, or if you are at risk for osteoporosis and fractures, ask your health care provider if you should be screened.  Ask your health care provider whether you should take a calcium or vitamin D supplement to lower your risk for osteoporosis.  Menopause may have certain physical symptoms and risks.  Hormone replacement therapy may reduce some of these symptoms and risks. Talk to your health care provider about whether hormone replacement therapy is right for you. Follow these instructions at home:  Schedule regular health, dental, and eye exams.  Stay current with your immunizations.  Do not use any tobacco products including cigarettes, chewing tobacco, or electronic cigarettes.  If you are pregnant, do not drink alcohol.  If you are breastfeeding, limit how much and how often you drink alcohol.  Limit alcohol intake to no more than 1 drink per day for nonpregnant women. One drink equals 12 ounces of beer, 5 ounces of wine, or 1 ounces of hard liquor.  Do not use street drugs.  Do not share needles.  Ask your health care  provider for help if you need support or information about quitting drugs.  Tell your health care provider if you often feel depressed.  Tell your health care provider if you have ever been abused or do not feel safe at home. This information is not intended to replace advice given to you by your health care provider. Make sure you discuss any questions you have with your health care provider. Document Released: 06/18/2011 Document Revised: 05/10/2016 Document Reviewed: 09/06/2015 Elsevier Interactive Patient Education  2018 Else[employer] Inc. Bacterial Vaginosis Bacterial vaginosis is an infection of the vagina. It happens when too many germs (bacteria) grow in the vagina. This infection puts you at risk for infections from sex (STIs). Treating this infection can lower your risk for some STIs. You should also treat this if you are pregnant. It can cause your baby to be born early. Follow these instructions at home: Medicines  Take over-the-counter and prescription medicines only as told by your doctor.  Take or use your antibiotic medicine as told by your doctor. Do not stop taking or using it even if you start to feel better. General instructions  If you your sexual partner is a woman, tell her that you have this infection. She needs to get treatment if she has symptoms. If you have a female partner, he does not need to be treated.  During treatment: ? Avoid sex. ? Do not douche. ? Avoid alcohol as told. ? Avoid breastfeeding as told.  Drink enough fluid to keep your pee (urine) clear or pale yellow.  Keep your vagina and butt (rectum) clean. ? Wash the area with warm water every day. ? Wipe from front to back after you use the toilet.  Keep all follow-up visits as told by your doctor. This is important. Preventing this condition  Do not douche.  Use only warm water to wash around your vagina.  Use protection when you have sex. This includes: ? Latex condoms. ? Dental  dams.  Limit   how many people you have sex with. It is best to only have sex with the same person (be monogamous).  Get tested for STIs. Have your partner get tested.  Wear underwear that is cotton or lined with cotton.  Avoid tight pants and pantyhose. This is most important in summer.  Do not use any products that have nicotine or tobacco in them. These include cigarettes and e-cigarettes. If you need help quitting, ask your doctor.  Do not use illegal drugs.  Limit how much alcohol you drink. Contact a doctor if:  Your symptoms do not get better, even after you are treated.  You have more discharge or pain when you pee (urinate).  You have a fever.  You have pain in your belly (abdomen).  You have pain with sex.  Your bleed from your vagina between periods. Summary  This infection happens when too many germs (bacteria) grow in the vagina.  Treating this condition can lower your risk for some infections from sex (STIs).  You should also treat this if you are pregnant. It can cause early (premature) birth.  Do not stop taking or using your antibiotic medicine even if you start to feel better. This information is not intended to replace advice given to you by your health care provider. Make sure you discuss any questions you have with your health care provider. Document Released: 09/11/2008 Document Revised: 08/18/2016 Document Reviewed: 08/18/2016 Elsevier Interactive Patient Education  2017 Else[employer] Inc.  

## 2017-11-07 LAB — URINALYSIS W MICROSCOPIC + REFLEX CULTURE
BILIRUBIN URINE: NEGATIVE
Bacteria, UA: NONE SEEN /HPF
Glucose, UA: NEGATIVE
HYALINE CAST: NONE SEEN /LPF
Hgb urine dipstick: NEGATIVE
Ketones, ur: NEGATIVE
Leukocyte Esterase: NEGATIVE
Nitrites, Initial: NEGATIVE
PROTEIN: NEGATIVE
RBC / HPF: NONE SEEN /HPF (ref 0–2)
SQUAMOUS EPITHELIAL / LPF: NONE SEEN /HPF (ref <=5)
Specific Gravity, Urine: 1.018 (ref 1.001–1.03)
pH: <=5 (ref 5.0–8.0)

## 2017-11-07 LAB — NO CULTURE INDICATED

## 2017-11-08 LAB — HEPATITIS B SURFACE ANTIGEN: HEP B S AG: NONREACTIVE

## 2017-11-08 LAB — RPR: RPR Ser Ql: NONREACTIVE

## 2017-11-08 LAB — C. TRACHOMATIS/N. GONORRHOEAE RNA
C. TRACHOMATIS RNA, TMA: NOT DETECTED
N. gonorrhoeae RNA, TMA: NOT DETECTED

## 2017-11-08 LAB — HEPATITIS C ANTIBODY
Hepatitis C Ab: NONREACTIVE
SIGNAL TO CUT-OFF: 0.02 (ref <1.00)

## 2017-11-08 LAB — HIV ANTIBODY (ROUTINE TESTING W REFLEX): HIV: NONREACTIVE

## 2018-01-20 DIAGNOSIS — E7439 Other disorders of intestinal carbohydrate absorption: Secondary | ICD-10-CM | POA: Diagnosis not present

## 2018-01-20 DIAGNOSIS — I10 Essential (primary) hypertension: Secondary | ICD-10-CM | POA: Diagnosis not present

## 2018-01-20 DIAGNOSIS — Z0000 Encounter for general adult medical examination without abnormal findings: Secondary | ICD-10-CM | POA: Diagnosis not present

## 2018-01-20 DIAGNOSIS — E079 Disorder of thyroid, unspecified: Secondary | ICD-10-CM | POA: Diagnosis not present

## 2018-01-30 MED FILL — LEVOTHYROXINE 25 MCG TABLET: 25 | 90 days supply | Qty: 90 | Fill #1

## 2018-01-30 MED FILL — NORETHINDRONE 0.35 MG TAB: 0.35 | 84 days supply | Qty: 84 | Fill #0

## 2018-01-30 MED FILL — LISINOPRIL 10 MG TABLET: 10 | 90 days supply | Qty: 90 | Fill #0

## 2018-03-07 ENCOUNTER — Encounter: Payer: Self-pay | Admitting: Obstetrics & Gynecology

## 2018-03-07 ENCOUNTER — Ambulatory Visit (INDEPENDENT_AMBULATORY_CARE_PROVIDER_SITE_OTHER): Payer: 59 | Admitting: Obstetrics & Gynecology

## 2018-03-07 VITALS — BP 130/82

## 2018-03-07 DIAGNOSIS — N898 Other specified noninflammatory disorders of vagina: Secondary | ICD-10-CM | POA: Diagnosis not present

## 2018-03-07 LAB — WET PREP FOR TRICH, YEAST, CLUE

## 2018-03-07 MED ORDER — TINIDAZOLE 500 MG PO TABS: 2.0000 g | ORAL_TABLET | Freq: Every day | ORAL | 0 refills | Status: AC

## 2018-03-07 MED ORDER — FLUCONAZOLE 150 MG PO TABS: 150.0000 mg | ORAL_TABLET | Freq: Once | ORAL | 2 refills | Status: AC

## 2018-03-07 MED FILL — TINIDAZOLE 500 MG TABLET: 500 | 2 days supply | Qty: 8 | Fill #0

## 2018-03-07 MED FILL — FLUCONAZOLE 150 MG TABLET: 150 | 1 days supply | Qty: 1 | Fill #0

## 2018-03-07 NOTE — Patient Instructions (Signed)
1. Vaginal discharge Bacterial vaginosis confirmed on wet prep.  Will treat with tinidazole 4 tablets per mouth today and 4 tablets tomorrow.  Will then follow with 1 tablet of fluconazole to prevent yeast vaginitis post antibiotics.  Usage reviewed and prescription sent to pharmacy. - WET PREP FOR TRICH, YEAST, CLUE  2. Vaginal irritation As above. - WET PREP FOR [place], YEAST, CLUE  Other orders - tinidazole (TINDAMAX) 500 MG tablet; Take 4 tablets (2,000 mg total) by mouth daily for 2 days. - fluconazole (DIFLUCAN) 150 MG tablet; Take 1 tablet (150 mg total) by mouth once for 1 dose. After the Tinidazole treatment.  Kathryn Buck, it was a pleasure meeting you today!   Bacterial Vaginosis Bacterial vaginosis is a vaginal infection that occurs when the normal balance of bacteria in the vagina is disrupted. It results from an overgrowth of certain bacteria. This is the most common vaginal infection among women ages 37-44. Because bacterial vaginosis increases your risk for STIs (sexually transmitted infections), getting treated can help reduce your risk for chlamydia, gonorrhea, herpes, and HIV (human immunodeficiency virus). Treatment is also important for preventing complications in pregnant women, because this condition can cause an early (premature) delivery. What are the causes? This condition is caused by an increase in harmful bacteria that are normally present in small amounts in the vagina. However, the reason that the condition develops is not fully understood. What increases the risk? The following factors may make you more likely to develop this condition:  Having a new sexual partner or multiple sexual partners.  Having unprotected sex.  Douching.  Having an intrauterine device (IUD).  Smoking.  Drug and alcohol abuse.  Taking certain antibiotic medicines.  Being pregnant.  You cannot get bacterial vaginosis from toilet seats, bedding, swimming pools, or contact with  objects around you. What are the signs or symptoms? Symptoms of this condition include:  Grey or white vaginal discharge. The discharge can also be watery or foamy.  A fish-like odor with discharge, especially after sexual intercourse or during menstruation.  Itching in and around the vagina.  Burning or pain with urination.  Some women with bacterial vaginosis have no signs or symptoms. How is this diagnosed? This condition is diagnosed based on:  Your medical history.  A physical exam of the vagina.  Testing a sample of vaginal fluid under a microscope to look for a large amount of bad bacteria or abnormal cells. Your health care provider may use a cotton swab or a small wooden spatula to collect the sample.  How is this treated? This condition is treated with antibiotics. These may be given as a pill, a vaginal cream, or a medicine that is put into the vagina (suppository). If the condition comes back after treatment, a second round of antibiotics may be needed. Follow these instructions at home: Medicines  Take over-the-counter and prescription medicines only as told by your health care provider.  Take or use your antibiotic as told by your health care provider. Do not stop taking or using the antibiotic even if you start to feel better. General instructions  If you have a female sexual partner, tell her that you have a vaginal infection. She should see her health care provider and be treated if she has symptoms. If you have a female sexual partner, he does not need treatment.  During treatment: ? Avoid sexual activity until you finish treatment. ? Do not douche. ? Avoid alcohol as directed by your health care provider. ?  Avoid breastfeeding as directed by your health care provider.  Drink enough water and fluids to keep your urine clear or pale yellow.  Keep the area around your vagina and rectum clean. ? Wash the area daily with warm water. ? Wipe yourself from front  to back after using the toilet.  Keep all follow-up visits as told by your health care provider. This is important. How is this prevented?  Do not douche.  Wash the outside of your vagina with warm water only.  Use protection when having sex. This includes latex condoms and dental dams.  Limit how many sexual partners you have. To help prevent bacterial vaginosis, it is best to have sex with just one partner (monogamous).  Make sure you and your sexual partner are tested for STIs.  Wear cotton or cotton-lined underwear.  Avoid wearing tight pants and pantyhose, especially during summer.  Limit the amount of alcohol that you drink.  Do not use any products that contain nicotine or tobacco, such as cigarettes and e-cigarettes. If you need help quitting, ask your health care provider.  Do not use illegal drugs. Where to find more information:  Centers for Disease Control and Prevention: AppraiserFraud.fi  American Sexual Health Association (ASHA): www.ashastd.org  [place] Department of Health and Financial controller, Office on Women's Health: DustingSprays.pl or SecuritiesCard.it Contact a health care provider if:  Your symptoms do not improve, even after treatment.  You have more discharge or pain when urinating.  You have a fever.  You have pain in your abdomen.  You have pain during sex.  You have vaginal bleeding between periods. Summary  Bacterial vaginosis is a vaginal infection that occurs when the normal balance of bacteria in the vagina is disrupted.  Because bacterial vaginosis increases your risk for STIs (sexually transmitted infections), getting treated can help reduce your risk for chlamydia, gonorrhea, herpes, and HIV (human immunodeficiency virus). Treatment is also important for preventing complications in pregnant women, because the condition can cause an early (premature) delivery.  This condition is treated with  antibiotic medicines. These may be given as a pill, a vaginal cream, or a medicine that is put into the vagina (suppository). This information is not intended to replace advice given to you by your health care provider. Make sure you discuss any questions you have with your health care provider. Document Released: 12/03/2005 Document Revised: 04/08/2017 Document Reviewed: 08/18/2016 Elsevier Interactive Patient Education  [employer].

## 2018-03-07 NOTE — Progress Notes (Signed)
    Kathryn Buck 1986-04-09 [id number]        32 y.o.  G0P0   RP: Increased vaginal discharge with itching  HPI: Treated a bladder infection with Keflex recently.  Post treatment patient developed an increased vaginal discharge with itching in the vagina and at the vulva.  Tried probiotic and Monistat without full relief of symptoms.  No pelvic pain.  Abstinent.  On the progestin pill for cycle control.  UTI symptoms resolved.  No fever.   OB History  Gravida Para Term Preterm AB Living  0 0          SAB TAB Ectopic Multiple Live Births               Past medical history,surgical history, problem list, medications, allergies, family history and social history were all reviewed and documented in the EPIC chart.   Directed ROS with pertinent positives and negatives documented in the history of present illness/assessment and plan.  Exam:  Vitals:   03/07/18 1119  BP: 130/82   General appearance:  Normal   Gynecologic exam: Vulvar irritation with erythema and increased discharge.  Speculum: Cervix and vagina normal.  Increased thick beige discharge.  Wet prep done.   Assessment/Plan:  32 y.o. G0P0   1. Vaginal discharge Bacterial vaginosis confirmed on wet prep.  Will treat with tinidazole 4 tablets per mouth today and 4 tablets tomorrow.  Will then follow with 1 tablet of fluconazole to prevent yeast vaginitis post antibiotics.  Usage reviewed and prescription sent to pharmacy. - WET PREP FOR TRICH, YEAST, CLUE  2. Vaginal irritation As above. - WET PREP FOR [place], YEAST, CLUE  Other orders - tinidazole (TINDAMAX) 500 MG tablet; Take 4 tablets (2,000 mg total) by mouth daily for 2 days. - fluconazole (DIFLUCAN) 150 MG tablet; Take 1 tablet (150 mg total) by mouth once for 1 dose. After the Tinidazole treatment.  Princess Bruins MD, 11:29 AM 03/07/2018

## 2018-03-10 ENCOUNTER — Ambulatory Visit: Payer: 59 | Admitting: Women's Health

## 2018-04-24 MED FILL — LISINOPRIL 10 MG TABS: 10 | 90 days supply | Qty: 90 | Fill #1

## 2018-04-24 MED FILL — NORETHINDRONE 0.35 MG TAB: 0.35 | 84 days supply | Qty: 84 | Fill #1

## 2018-04-24 MED FILL — LEVOTHYROXINE 25 MCG TABLET: 25 | 90 days supply | Qty: 90 | Fill #2

## 2018-08-05 DIAGNOSIS — R7303 Prediabetes: Secondary | ICD-10-CM | POA: Diagnosis not present

## 2018-08-05 DIAGNOSIS — J069 Acute upper respiratory infection, unspecified: Secondary | ICD-10-CM | POA: Diagnosis not present

## 2018-08-05 DIAGNOSIS — Z6841 Body Mass Index (BMI) 40.0 and over, adult: Secondary | ICD-10-CM | POA: Diagnosis not present

## 2018-08-05 DIAGNOSIS — Z713 Dietary counseling and surveillance: Secondary | ICD-10-CM | POA: Diagnosis not present

## 2018-08-05 DIAGNOSIS — I10 Essential (primary) hypertension: Secondary | ICD-10-CM | POA: Diagnosis not present

## 2018-08-05 DIAGNOSIS — E669 Obesity, unspecified: Secondary | ICD-10-CM | POA: Diagnosis not present

## 2018-08-05 DIAGNOSIS — J029 Acute pharyngitis, unspecified: Secondary | ICD-10-CM | POA: Diagnosis not present

## 2018-08-05 MED FILL — AMOXICILLIN 500 MG CAPSULE: 500 | 10 days supply | Qty: 40 | Fill #0

## 2018-08-22 MED FILL — LISINOPRIL 10 MG TABLET: 10 | 90 days supply | Qty: 90 | Fill #0

## 2018-08-22 MED FILL — LEVOTHYROXINE 25 MCG TABLET: 25 | 90 days supply | Qty: 90 | Fill #3

## 2018-11-10 ENCOUNTER — Encounter: Payer: Self-pay | Admitting: Women's Health

## 2018-11-10 ENCOUNTER — Ambulatory Visit (INDEPENDENT_AMBULATORY_CARE_PROVIDER_SITE_OTHER): Payer: 59 | Admitting: Women's Health

## 2018-11-10 VITALS — BP 120/82 | Ht 63.0 in | Wt 232.0 lb

## 2018-11-10 DIAGNOSIS — Z13220 Encounter for screening for lipoid disorders: Secondary | ICD-10-CM | POA: Diagnosis not present

## 2018-11-10 DIAGNOSIS — E038 Other specified hypothyroidism: Secondary | ICD-10-CM | POA: Diagnosis not present

## 2018-11-10 DIAGNOSIS — Z113 Encounter for screening for infections with a predominantly sexual mode of transmission: Secondary | ICD-10-CM

## 2018-11-10 DIAGNOSIS — Z01419 Encounter for gynecological examination (general) (routine) without abnormal findings: Secondary | ICD-10-CM

## 2018-11-10 MED ORDER — LEVOTHYROXINE SODIUM 25 MCG PO TABS: 25.0000 ug | ORAL_TABLET | Freq: Every day | ORAL | 4 refills | Status: DC

## 2018-11-10 MED FILL — LEVOTHYROXINE 25 MCG TABLET: 25 | 90 days supply | Qty: 90 | Fill #0

## 2018-11-10 NOTE — Patient Instructions (Signed)
Health Maintenance, Female Adopting a healthy lifestyle and getting preventive care can go a long way to promote health and wellness. Talk with your health care provider about what schedule of regular examinations is right for you. This is a good chance for you to check in with your provider about disease prevention and staying healthy. In between checkups, there are plenty of things you can do on your own. Experts have done a lot of research about which lifestyle changes and preventive measures are most likely to keep you healthy. Ask your health care provider for more information. Weight and diet Eat a healthy diet  Be sure to include plenty of vegetables, fruits, low-fat dairy products, and lean protein.  Do not eat a lot of foods high in solid fats, added sugars, or salt.  Get regular exercise. This is one of the most important things you can do for your health. ? Most adults should exercise for at least 150 minutes each week. The exercise should increase your heart rate and make you sweat (moderate-intensity exercise). ? Most adults should also do strengthening exercises at least twice a week. This is in addition to the moderate-intensity exercise.  Maintain a healthy weight  Body mass index (BMI) is a measurement that can be used to identify possible weight problems. It estimates body fat based on height and weight. Your health care provider can help determine your BMI and help you achieve or maintain a healthy weight.  For females 20 years of age and older: ? A BMI below 18.5 is considered underweight. ? A BMI of 18.5 to 24.9 is normal. ? A BMI of 25 to 29.9 is considered overweight. ? A BMI of 30 and above is considered obese.  Watch levels of cholesterol and blood lipids  You should start having your blood tested for lipids and cholesterol at 33 years of age, then have this test every 5 years.  You may need to have your cholesterol levels checked more often if: ? Your lipid or  cholesterol levels are high. ? You are older than 33 years of age. ? You are at high risk for heart disease.  Cancer screening Lung Cancer  Lung cancer screening is recommended for adults 55-80 years old who are at high risk for lung cancer because of a history of smoking.  A yearly low-dose CT scan of the lungs is recommended for people who: ? Currently smoke. ? Have quit within the past 15 years. ? Have at least a 30-pack-year history of smoking. A pack year is smoking an average of one pack of cigarettes a day for 1 year.  Yearly screening should continue until it has been 15 years since you quit.  Yearly screening should stop if you develop a health problem that would prevent you from having lung cancer treatment.  Breast Cancer  Practice breast self-awareness. This means understanding how your breasts normally appear and feel.  It also means doing regular breast self-exams. Let your health care provider know about any changes, no matter how small.  If you are in your 20s or 30s, you should have a clinical breast exam (CBE) by a health care provider every 1-3 years as part of a regular health exam.  If you are 40 or older, have a CBE every year. Also consider having a breast X-ray (mammogram) every year.  If you have a family history of breast cancer, talk to your health care provider about genetic screening.  If you are at high risk   for breast cancer, talk to your health care provider about having an MRI and a mammogram every year.  Breast cancer gene (BRCA) assessment is recommended for women who have family members with BRCA-related cancers. BRCA-related cancers include: ? Breast. ? Ovarian. ? Tubal. ? Peritoneal cancers.  Results of the assessment will determine the need for genetic counseling and BRCA1 and BRCA2 testing.  Cervical Cancer Your health care provider may recommend that you be screened regularly for cancer of the pelvic organs (ovaries, uterus, and  vagina). This screening involves a pelvic examination, including checking for microscopic changes to the surface of your cervix (Pap test). You may be encouraged to have this screening done every 3 years, beginning at age 22.  For women ages 56-65, health care providers may recommend pelvic exams and Pap testing every 3 years, or they may recommend the Pap and pelvic exam, combined with testing for human papilloma virus (HPV), every 5 years. Some types of HPV increase your risk of cervical cancer. Testing for HPV may also be done on women of any age with unclear Pap test results.  Other health care providers may not recommend any screening for nonpregnant women who are considered low risk for pelvic cancer and who do not have symptoms. Ask your health care provider if a screening pelvic exam is right for you.  If you have had past treatment for cervical cancer or a condition that could lead to cancer, you need Pap tests and screening for cancer for at least 20 years after your treatment. If Pap tests have been discontinued, your risk factors (such as having a new sexual partner) need to be reassessed to determine if screening should resume. Some women have medical problems that increase the chance of getting cervical cancer. In these cases, your health care provider may recommend more frequent screening and Pap tests.  Colorectal Cancer  This type of cancer can be detected and often prevented.  Routine colorectal cancer screening usually begins at 33 years of age and continues through 33 years of age.  Your health care provider may recommend screening at an earlier age if you have risk factors for colon cancer.  Your health care provider may also recommend using home test kits to check for hidden blood in the stool.  A small camera at the end of a tube can be used to examine your colon directly (sigmoidoscopy or colonoscopy). This is done to check for the earliest forms of colorectal  cancer.  Routine screening usually begins at age 33.  Direct examination of the colon should be repeated every 5-10 years through 33 years of age. However, you may need to be screened more often if early forms of precancerous polyps or small growths are found.  Skin Cancer  Check your skin from head to toe regularly.  Tell your health care provider about any new moles or changes in moles, especially if there is a change in a mole's shape or color.  Also tell your health care provider if you have a mole that is larger than the size of a pencil eraser.  Always use sunscreen. Apply sunscreen liberally and repeatedly throughout the day.  Protect yourself by wearing long sleeves, pants, a wide-brimmed hat, and sunglasses whenever you are outside.  Heart disease, diabetes, and high blood pressure  High blood pressure causes heart disease and increases the risk of stroke. High blood pressure is more likely to develop in: ? People who have blood pressure in the high end of  the normal range (130-139/85-89 mm Hg). ? People who are overweight or obese. ? People who are African American.  If you are 21-29 years of age, have your blood pressure checked every 3-5 years. If you are 3 years of age or older, have your blood pressure checked every year. You should have your blood pressure measured twice-once when you are at a hospital or clinic, and once when you are not at a hospital or clinic. Record the average of the two measurements. To check your blood pressure when you are not at a hospital or clinic, you can use: ? An automated blood pressure machine at a pharmacy. ? A home blood pressure monitor.  If you are between 17 years and 37 years old, ask your health care provider if you should take aspirin to prevent strokes.  Have regular diabetes screenings. This involves taking a blood sample to check your fasting blood sugar level. ? If you are at a normal weight and have a low risk for diabetes,  have this test once every three years after 33 years of age. ? If you are overweight and have a high risk for diabetes, consider being tested at a younger age or more often. Preventing infection Hepatitis B  If you have a higher risk for hepatitis B, you should be screened for this virus. You are considered at high risk for hepatitis B if: ? You were born in a country where hepatitis B is common. Ask your health care provider which countries are considered high risk. ? Your parents were born in a high-risk country, and you have not been immunized against hepatitis B (hepatitis B vaccine). ? You have HIV or AIDS. ? You use needles to inject street drugs. ? You live with someone who has hepatitis B. ? You have had sex with someone who has hepatitis B. ? You get hemodialysis treatment. ? You take certain medicines for conditions, including cancer, organ transplantation, and autoimmune conditions.  Hepatitis C  Blood testing is recommended for: ? Everyone born from [birth year] through 1966. ? Anyone with known risk factors for hepatitis C.  Sexually transmitted infections (STIs)  You should be screened for sexually transmitted infections (STIs) including gonorrhea and chlamydia if: ? You are sexually active and are younger than 33 years of age. ? You are older than 33 years of age and your health care provider tells you that you are at risk for this type of infection. ? Your sexual activity has changed since you were last screened and you are at an increased risk for chlamydia or gonorrhea. Ask your health care provider if you are at risk.  If you do not have HIV, but are at risk, it may be recommended that you take a prescription medicine daily to prevent HIV infection. This is called pre-exposure prophylaxis (PrEP). You are considered at risk if: ? You are sexually active and do not regularly use condoms or know the HIV status of your partner(s). ? You take drugs by injection. ? You are  sexually active with a partner who has HIV.  Talk with your health care provider about whether you are at high risk of being infected with HIV. If you choose to begin PrEP, you should first be tested for HIV. You should then be tested every 3 months for as long as you are taking PrEP. Pregnancy  If you are premenopausal and you may become pregnant, ask your health care provider about preconception counseling.  If you may become  pregnant, take 400 to 800 micrograms (mcg) of folic acid every day.  If you want to prevent pregnancy, talk to your health care provider about birth control (contraception). Osteoporosis and menopause  Osteoporosis is a disease in which the bones lose minerals and strength with aging. This can result in serious bone fractures. Your risk for osteoporosis can be identified using a bone density scan.  If you are 65 years of age or older, or if you are at risk for osteoporosis and fractures, ask your health care provider if you should be screened.  Ask your health care provider whether you should take a calcium or vitamin D supplement to lower your risk for osteoporosis.  Menopause may have certain physical symptoms and risks.  Hormone replacement therapy may reduce some of these symptoms and risks. Talk to your health care provider about whether hormone replacement therapy is right for you. Follow these instructions at home:  Schedule regular health, dental, and eye exams.  Stay current with your immunizations.  Do not use any tobacco products including cigarettes, chewing tobacco, or electronic cigarettes.  If you are pregnant, do not drink alcohol.  If you are breastfeeding, limit how much and how often you drink alcohol.  Limit alcohol intake to no more than 1 drink per day for nonpregnant women. One drink equals 12 ounces of beer, 5 ounces of wine, or 1 ounces of hard liquor.  Do not use street drugs.  Do not share needles.  Ask your health care  provider for help if you need support or information about quitting drugs.  Tell your health care provider if you often feel depressed.  Tell your health care provider if you have ever been abused or do not feel safe at home. This information is not intended to replace advice given to you by your health care provider. Make sure you discuss any questions you have with your health care provider. Document Released: 06/18/2011 Document Revised: 05/10/2016 Document Reviewed: 09/06/2015 Elsevier Interactive Patient Education  2018 [employer] Carbohydrate Counting for Diabetes Mellitus, Adult Carbohydrate counting is a method for keeping track of how many carbohydrates you eat. Eating carbohydrates naturally increases the amount of sugar (glucose) in the blood. Counting how many carbohydrates you eat helps keep your blood glucose within normal limits, which helps you manage your diabetes (diabetes mellitus). It is important to know how many carbohydrates you can safely have in each meal. This is different for every person. A diet and nutrition specialist (registered dietitian) can help you make a meal plan and calculate how many carbohydrates you should have at each meal and snack. Carbohydrates are found in the following foods:  Grains, such as breads and cereals.  Dried beans and soy products.  Starchy vegetables, such as potatoes, peas, and corn.  Fruit and fruit juices.  Milk and yogurt.  Sweets and snack foods, such as cake, cookies, candy, chips, and soft drinks.  How do I count carbohydrates? There are two ways to count carbohydrates in food. You can use either of the methods or a combination of both. Reading "Nutrition Facts" on packaged food The "Nutrition Facts" list is included on the labels of almost all packaged foods and beverages in the [place] It includes:  The serving size.  Information about nutrients in each serving, including the grams (g) of carbohydrate per  serving.  To use the "Nutrition Facts":  Decide how many servings you will have.  Multiply the number of servings by the number of carbohydrates   per serving.  The resulting number is the total amount of carbohydrates that you will be having.  Learning standard serving sizes of other foods When you eat foods containing carbohydrates that are not packaged or do not include "Nutrition Facts" on the label, you need to measure the servings in order to count the amount of carbohydrates:  Measure the foods that you will eat with a food scale or measuring cup, if needed.  Decide how many standard-size servings you will eat.  Multiply the number of servings by 15. Most carbohydrate-rich foods have about 15 g of carbohydrates per serving. ? For example, if you eat 8 oz (170 g) of strawberries, you will have eaten 2 servings and 30 g of carbohydrates (2 servings x 15 g = 30 g).  For foods that have more than one food mixed, such as soups and casseroles, you must count the carbohydrates in each food that is included.  The following list contains standard serving sizes of common carbohydrate-rich foods. Each of these servings has about 15 g of carbohydrates:   hamburger bun or  English muffin.   oz (15 mL) syrup.   oz (14 g) jelly.  1 slice of bread.  1 six-inch tortilla.  3 oz (85 g) cooked rice or pasta.  4 oz (113 g) cooked dried beans.  4 oz (113 g) starchy vegetable, such as peas, corn, or potatoes.  4 oz (113 g) hot cereal.  4 oz (113 g) mashed potatoes or  of a large baked potato.  4 oz (113 g) canned or frozen fruit.  4 oz (120 mL) fruit juice.  4-6 crackers.  6 chicken nuggets.  6 oz (170 g) unsweetened dry cereal.  6 oz (170 g) plain fat-free yogurt or yogurt sweetened with artificial sweeteners.  8 oz (240 mL) milk.  8 oz (170 g) fresh fruit or one small piece of fruit.  24 oz (680 g) popped popcorn.  Example of carbohydrate counting Sample meal  3  oz (85 g) chicken breast.  6 oz (170 g) [place] rice.  4 oz (113 g) corn.  8 oz (240 mL) milk.  8 oz (170 g) strawberries with sugar-free whipped topping. Carbohydrate calculation 1. Identify the foods that contain carbohydrates: ? Rice. ? Corn. ? Milk. ? Strawberries. 2. Calculate how many servings you have of each food: ? 2 servings rice. ? 1 serving corn. ? 1 serving milk. ? 1 serving strawberries. 3. Multiply each number of servings by 15 g: ? 2 servings rice x 15 g = 30 g. ? 1 serving corn x 15 g = 15 g. ? 1 serving milk x 15 g = 15 g. ? 1 serving strawberries x 15 g = 15 g. 4. Add together all of the amounts to find the total grams of carbohydrates eaten: ? 30 g + 15 g + 15 g + 15 g = 75 g of carbohydrates total. This information is not intended to replace advice given to you by your health care provider. Make sure you discuss any questions you have with your health care provider. Document Released: 12/03/2005 Document Revised: 06/22/2016 Document Reviewed: 05/16/2016 Elsevier Interactive Patient Education  [employer].

## 2018-11-10 NOTE — Progress Notes (Signed)
Kathryn Buck 07/18/1986 [id number]    History:    Presents for annual exam.  Regular monthly cycle.  Stopped Micronor greater than 6 months ago and has had monthly cycle since.  History of PCOS with amenorrhea.  2014 dermoid.  New partner in the past year.  Normal Pap history.  Gardasil series completed.  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  Hypothyroid on Synthroid 25 mcg.  Past medical history, past surgical history, family history and social history were all reviewed and documented in the EPIC chart.  Travel nurse.  Father hypertension and hypercholesteremia.  Sister, mother diabetes, mother died at age 52 from  MI.  ROS:  A ROS was performed and pertinent positives and negatives are included.  Exam:  Vitals:   11/10/18 1144  BP: 120/82  Weight: 232 lb (105.2 kg)  Height: 5\' 3"  (1.6 m)   Body mass index is 41.1 kg/m.   General appearance:  Normal Thyroid:  Symmetrical, normal in size, without palpable masses or nodularity. Respiratory  Auscultation:  Clear without wheezing or rhonchi Cardiovascular  Auscultation:  Regular rate, without rubs, murmurs or gallops  Edema/varicosities:  Not grossly evident Abdominal  Soft,nontender, without masses, guarding or rebound.  Liver/spleen:  No organomegaly noted  Hernia:  None appreciated  Skin  Inspection:  Grossly normal   Breasts: Examined lying and sitting.     Right: Without masses, retractions, discharge or axillary adenopathy.     Left: Without masses, retractions, discharge or axillary adenopathy. Gentitourinary   Inguinal/mons:  Normal without inguinal adenopathy  External genitalia:  Normal  BUS/Urethra/Skene's glands:  Normal  Vagina:  Normal  Cervix:  Normal  Uterus:  normal in size, shape and contour.  Midline and mobile  Adnexa/parametria:     Rt: Without masses or tenderness.   Lt: Without masses or tenderness.  Anus and perineum: Normal  Digital rectal exam: Normal sphincter tone without palpated  masses or tenderness  Assessment/Plan:  32 y.o. SBF G0 for annual exam with no complaints.  Monthly cycle/condoms Hypertension-primary care manages labs and meds History of PCOS Hypothyroid STD screen Obesity  Plan: Contraception options reviewed, declines will continue condoms, Plan B emergency contraception reviewed.  SBE's, exercise, calcium rich foods, MVI daily encouraged.  Reviewed importance of increasing activity and decreasing calorie/carbs.  Synthroid 25 mcg p.o. daily prescription, proper use given and reviewed.  CBC, TSH, CMP, lipid panel, GC/chlamydia, HIV, hep B, C, RPR, Pap with HR HPV typing.    Hazel, 1:17 PM 11/10/2018

## 2018-11-11 ENCOUNTER — Other Ambulatory Visit: Payer: 59

## 2018-11-11 LAB — C. TRACHOMATIS/N. GONORRHOEAE RNA
C. trachomatis RNA, TMA: NOT DETECTED
N. gonorrhoeae RNA, TMA: NOT DETECTED

## 2018-11-12 LAB — PAP, TP IMAGING W/ HPV RNA, RFLX HPV TYPE 16,18/45: HPV DNA HIGH RISK: NOT DETECTED

## 2018-11-15 LAB — CBC WITH DIFFERENTIAL/PLATELET
BASOS ABS: 53 {cells}/uL (ref 0–200)
Basophils Relative: 0.7 %
EOS PCT: 2.1 %
Eosinophils Absolute: 158 cells/uL (ref 15–500)
HCT: 40.8 % (ref 35.0–45.0)
Hemoglobin: 13.4 g/dL (ref 11.7–15.5)
LYMPHS ABS: 2993 {cells}/uL (ref 850–3900)
MCH: 27.5 pg (ref 27.0–33.0)
MCHC: 32.8 g/dL (ref 32.0–36.0)
MCV: 83.6 fL (ref 80.0–100.0)
MONOS PCT: 5.2 %
MPV: 10 fL (ref 7.5–12.5)
NEUTROS ABS: 3908 {cells}/uL (ref 1500–7800)
NEUTROS PCT: 52.1 %
Platelets: 367 10*3/uL (ref 140–400)
RBC: 4.88 10*6/uL (ref 3.80–5.10)
RDW: 12.4 % (ref 11.0–15.0)
Total Lymphocyte: 39.9 %
WBC mixed population: 390 cells/uL (ref 200–950)
WBC: 7.5 10*3/uL (ref 3.8–10.8)

## 2018-11-15 LAB — LIPID PANEL
CHOL/HDL RATIO: 2.3 (calc) (ref <5.0)
CHOLESTEROL: 98 mg/dL (ref <200)
HDL: 43 mg/dL — ABNORMAL LOW (ref >50)
LDL Cholesterol (Calc): 41 mg/dL (calc)
Non-HDL Cholesterol (Calc): 55 mg/dL (calc) (ref <130)
Triglycerides: 67 mg/dL (ref <150)

## 2018-11-15 LAB — HEPATITIS C ANTIBODY
Hepatitis C Ab: NONREACTIVE
SIGNAL TO CUT-OFF: 0.02 (ref <1.00)

## 2018-11-15 LAB — TEST AUTHORIZATION

## 2018-11-15 LAB — HEMOGLOBIN A1C
Hgb A1c MFr Bld: 5.5 % of total Hgb (ref <5.7)
Mean Plasma Glucose: 111 (calc)
eAG (mmol/L): 6.2 (calc)

## 2018-11-15 LAB — HEPATITIS B SURFACE ANTIGEN: HEP B S AG: NONREACTIVE

## 2018-11-15 LAB — RPR: RPR: NONREACTIVE

## 2018-11-15 LAB — HIV ANTIBODY (ROUTINE TESTING W REFLEX): HIV 1&2 Ab, 4th Generation: NONREACTIVE

## 2018-11-15 LAB — GLUCOSE, RANDOM: GLUCOSE: 109 mg/dL — AB (ref 65–99)

## 2018-12-02 MED FILL — LEVOTHYROXINE 25 MCG TABLET: 25 | 90 days supply | Qty: 90 | Fill #0

## 2018-12-02 MED FILL — LISINOPRIL 10 MG TABLET: 10 | 90 days supply | Qty: 90 | Fill #1

## 2019-07-20 MED FILL — METRONIDAZOLE 500 MG TABS: 500 | 7 days supply | Qty: 14 | Fill #0

## 2019-11-09 ENCOUNTER — Other Ambulatory Visit: Payer: Self-pay

## 2019-11-10 ENCOUNTER — Ambulatory Visit (INDEPENDENT_AMBULATORY_CARE_PROVIDER_SITE_OTHER): Payer: BC Managed Care – PPO | Admitting: Women's Health

## 2019-11-10 ENCOUNTER — Encounter: Payer: Self-pay | Admitting: Women's Health

## 2019-11-10 VITALS — BP 120/82 | Ht 63.0 in | Wt 243.8 lb

## 2019-11-10 DIAGNOSIS — R7309 Other abnormal glucose: Secondary | ICD-10-CM

## 2019-11-10 DIAGNOSIS — Z01419 Encounter for gynecological examination (general) (routine) without abnormal findings: Secondary | ICD-10-CM | POA: Diagnosis not present

## 2019-11-10 DIAGNOSIS — Z113 Encounter for screening for infections with a predominantly sexual mode of transmission: Secondary | ICD-10-CM

## 2019-11-10 DIAGNOSIS — E038 Other specified hypothyroidism: Secondary | ICD-10-CM | POA: Diagnosis not present

## 2019-11-10 LAB — WET PREP FOR TRICH, YEAST, CLUE

## 2019-11-10 MED ORDER — METRONIDAZOLE 500 MG PO TABS: 500.0000 mg | ORAL_TABLET | Freq: Two times a day (BID) | ORAL | 0 refills | Status: DC

## 2019-11-10 MED ORDER — LEVOTHYROXINE SODIUM 25 MCG PO TABS: 25.0000 ug | ORAL_TABLET | Freq: Every day | ORAL | 4 refills | Status: AC

## 2019-11-10 NOTE — Patient Instructions (Addendum)
Good to see you today!  Health Maintenance, Female  Bacterial Vaginosis  Bacterial vaginosis is an infection of the vagina. It happens when too many normal germs (healthy bacteria) grow in the vagina. This infection puts you at risk for infections from sex (STIs). Treating this infection can lower your risk for some STIs. You should also treat this if you are pregnant. It can cause your baby to be born early. Follow these instructions at home: Medicines  Take over-the-counter and prescription medicines only as told by your doctor.  Take or use your antibiotic medicine as told by your doctor. Do not stop taking or using it even if you start to feel better. General instructions  If you your sexual partner is a woman, tell her that you have this infection. She needs to get treatment if she has symptoms. If you have a female partner, he does not need to be treated.  During treatment: ? Avoid sex. ? Do not douche. ? Avoid alcohol as told. ? Avoid breastfeeding as told.  Drink enough fluid to keep your pee (urine) clear or pale yellow.  Keep your vagina and butt (rectum) clean. ? Wash the area with warm water every day. ? Wipe from front to back after you use the toilet.  Keep all follow-up visits as told by your doctor. This is important. Preventing this condition  Do not douche.  Use only warm water to wash around your vagina.  Use protection when you have sex. This includes: ? Latex condoms. ? Dental dams.  Limit how many people you have sex with. It is best to only have sex with the same person (be monogamous).  Get tested for STIs. Have your partner get tested.  Wear underwear that is cotton or lined with cotton.  Avoid tight pants and pantyhose. This is most important in summer.  Do not use any products that have nicotine or tobacco in them. These include cigarettes and e-cigarettes. If you need help quitting, ask your doctor.  Do not use illegal drugs.  Limit how  much alcohol you drink. Contact a doctor if:  Your symptoms do not get better, even after you are treated.  You have more discharge or pain when you pee (urinate).  You have a fever.  You have pain in your belly (abdomen).  You have pain with sex.  Your bleed from your vagina between periods. Summary  This infection happens when too many germs (bacteria) grow in the vagina.  Treating this condition can lower your risk for some infections from sex (STIs).  You should also treat this if you are pregnant. It can cause early (premature) birth.  Do not stop taking or using your antibiotic medicine even if you start to feel better. This information is not intended to replace advice given to you by your health care provider. Make sure you discuss any questions you have with your health care provider. Document Released: 09/11/2008 Document Revised: 11/15/2017 Document Reviewed: 08/18/2016 Elsevier Patient Education  2020 [employer].  Adopting a healthy lifestyle and getting preventive care are important in promoting health and wellness. Ask your health care provider about:  The right schedule for you to have regular tests and exams.  Things you can do on your own to prevent diseases and keep yourself healthy. What should I know about diet, weight, and exercise? Eat a healthy diet   Eat a diet that includes plenty of vegetables, fruits, low-fat dairy products, and lean protein.  Do not eat  a lot of foods that are high in solid fats, added sugars, or sodium. Maintain a healthy weight Body mass index (BMI) is used to identify weight problems. It estimates body fat based on height and weight. Your health care provider can help determine your BMI and help you achieve or maintain a healthy weight. Get regular exercise Get regular exercise. This is one of the most important things you can do for your health. Most adults should:  Exercise for at least 150 minutes each week. The  exercise should increase your heart rate and make you sweat (moderate-intensity exercise).  Do strengthening exercises at least twice a week. This is in addition to the moderate-intensity exercise.  Spend less time sitting. Even light physical activity can be beneficial. Watch cholesterol and blood lipids Have your blood tested for lipids and cholesterol at 34 years of age, then have this test every 5 years. Have your cholesterol levels checked more often if:  Your lipid or cholesterol levels are high.  You are older than 34 years of age.  You are at high risk for heart disease. What should I know about cancer screening? Depending on your health history and family history, you may need to have cancer screening at various ages. This may include screening for:  Breast cancer.  Cervical cancer.  Colorectal cancer.  Skin cancer.  Lung cancer. What should I know about heart disease, diabetes, and high blood pressure? Blood pressure and heart disease  High blood pressure causes heart disease and increases the risk of stroke. This is more likely to develop in people who have high blood pressure readings, are of African descent, or are overweight.  Have your blood pressure checked: ? Every 3-5 years if you are 69-27 years of age. ? Every year if you are 1 years old or older. Diabetes Have regular diabetes screenings. This checks your fasting blood sugar level. Have the screening done:  Once every three years after age 42 if you are at a normal weight and have a low risk for diabetes.  More often and at a younger age if you are overweight or have a high risk for diabetes. What should I know about preventing infection? Hepatitis B If you have a higher risk for hepatitis B, you should be screened for this virus. Talk with your health care provider to find out if you are at risk for hepatitis B infection. Hepatitis C Testing is recommended for:  Everyone born from [birth year] through  1965.  Anyone with known risk factors for hepatitis C. Sexually transmitted infections (STIs)  Get screened for STIs, including gonorrhea and chlamydia, if: ? You are sexually active and are younger than 34 years of age. ? You are older than 34 years of age and your health care provider tells you that you are at risk for this type of infection. ? Your sexual activity has changed since you were last screened, and you are at increased risk for chlamydia or gonorrhea. Ask your health care provider if you are at risk.  Ask your health care provider about whether you are at high risk for HIV. Your health care provider may recommend a prescription medicine to help prevent HIV infection. If you choose to take medicine to prevent HIV, you should first get tested for HIV. You should then be tested every 3 months for as long as you are taking the medicine. Pregnancy  If you are about to stop having your period (premenopausal) and you may become pregnant, seek  counseling before you get pregnant.  Take 400 to 800 micrograms (mcg) of folic acid every day if you become pregnant.  Ask for birth control (contraception) if you want to prevent pregnancy. Osteoporosis and menopause Osteoporosis is a disease in which the bones lose minerals and strength with aging. This can result in bone fractures. If you are 62 years old or older, or if you are at risk for osteoporosis and fractures, ask your health care provider if you should:  Be screened for bone loss.  Take a calcium or vitamin D supplement to lower your risk of fractures.  Be given hormone replacement therapy (HRT) to treat symptoms of menopause. Follow these instructions at home: Lifestyle  Do not use any products that contain nicotine or tobacco, such as cigarettes, e-cigarettes, and chewing tobacco. If you need help quitting, ask your health care provider.  Do not use street drugs.  Do not share needles.  Ask your health care provider for  help if you need support or information about quitting drugs. Alcohol use  Do not drink alcohol if: ? Your health care provider tells you not to drink. ? You are pregnant, may be pregnant, or are planning to become pregnant.  If you drink alcohol: ? Limit how much you use to 0-1 drink a day. ? Limit intake if you are breastfeeding.  Be aware of how much alcohol is in your drink. In the [place] one drink equals one 12 oz bottle of beer (355 mL), one 5 oz glass of wine (148 mL), or one 1 oz glass of hard liquor (44 mL). General instructions  Schedule regular health, dental, and eye exams.  Stay current with your vaccines.  Tell your health care provider if: ? You often feel depressed. ? You have ever been abused or do not feel safe at home. Summary  Adopting a healthy lifestyle and getting preventive care are important in promoting health and wellness.  Follow your health care provider's instructions about healthy diet, exercising, and getting tested or screened for diseases.  Follow your health care provider's instructions on monitoring your cholesterol and blood pressure. This information is not intended to replace advice given to you by your health care provider. Make sure you discuss any questions you have with your health care provider. Document Released: 06/18/2011 Document Revised: 11/26/2018 Document Reviewed: 11/26/2018 Elsevier Patient Education  2020 [employer].

## 2019-11-10 NOTE — Addendum Note (Signed)
Addended by: Joaquin Music on: 11/10/2019 03:17 PM   Modules accepted: Orders

## 2019-11-10 NOTE — Addendum Note (Signed)
Addended by: Thamas Jaegers on: 11/10/2019 03:10 PM   Modules accepted: Orders

## 2019-11-10 NOTE — Progress Notes (Signed)
Kathryn Buck Jan 29, 1986 [id number]    History:    Presents for annual exam.  Regular monthly 3 to 4-day cycle/new partner.  Normal Pap history.  2014 dermoid.  Gardasil series completed.  History of PCOS.  Normal hemoglobin A1c and lipid panel.  Past medical history, past surgical history, family history and social history were all reviewed and documented in the EPIC chart.  Travel nurse.  Sister, mother diabetes, mother deceased age 34 from an MI.  Father living hypertension hypercholesterolemia.  ROS:  A ROS was performed and pertinent positives and negatives are included.  Exam:  Vitals:   11/10/19 1412  BP: 120/82  Weight: 243 lb 12.8 oz (110.6 kg)  Height: 5\' 3"  (1.6 m)   Body mass index is 43.19 kg/m.   General appearance:  Normal Thyroid:  Symmetrical, normal in size, without palpable masses or nodularity. Respiratory  Auscultation:  Clear without wheezing or rhonchi Cardiovascular  Auscultation:  Regular rate, without rubs, murmurs or gallops  Edema/varicosities:  Not grossly evident Abdominal  Soft,nontender, without masses, guarding or rebound.  Liver/spleen:  No organomegaly noted  Hernia:  None appreciated  Skin  Inspection:  Grossly normal   Breasts: Examined lying and sitting.     Right: Without masses, retractions, discharge or axillary adenopathy.     Left: Without masses, retractions, discharge or axillary adenopathy. Gentitourinary   Inguinal/mons:  Normal without inguinal adenopathy  External genitalia:  Normal  BUS/Urethra/Skene's glands:  Normal  Vagina:  Normal  Cervix:  Normal  Uterus:   normal in size, shape and contour.  Midline and mobile  Adnexa/parametria:     Rt: Without masses or tenderness.   Lt: Without masses or tenderness.  Anus and perineum: Normal  Digital rectal exam: Normal sphincter tone without palpated masses or tenderness  Assessment/Plan:  33 y.o. SBF G0 for annual exam with complaint of occasional vaginal  discharge.  Regular monthly cycle/pregnancy okay STD screen Morbid obesity Bacterial vaginosis Hypothyroid on Synthroid Hypertension-primary care manages labs and meds History of a DVT  Plan: Flagyl 500 twice daily for 7 days, alcohol precautions reviewed.  Instructed to call if continued problems.  SBEs, exercise, calcium rich foods, MVI daily encouraged.  Reviewed importance of increasing exercise and decreasing calories/carbs, weight watchers encouraged.  Synthroid 25 mcg p.o. daily prescription, proper use given and reviewed.  TSH, T4, hemoglobin A1c GC/chlamydia, HIV, RPR.  Pap normal 2019, new screening guidelines reviewed.    Huel Cote [facility], 2:37 PM 11/10/2019

## 2019-11-11 LAB — T4: T4, Total: 7.5 ug/dL (ref 5.1–11.9)

## 2019-11-11 LAB — TSH: TSH: 2.55 mIU/L

## 2019-11-11 LAB — HEMOGLOBIN A1C
Hgb A1c MFr Bld: 6 % of total Hgb — ABNORMAL HIGH (ref <5.7)
Mean Plasma Glucose: 126 (calc)
eAG (mmol/L): 7 (calc)

## 2019-11-11 LAB — GC PROBE AMP THINPREP: N. gonorrhoeae RNA, TMA: NOT DETECTED

## 2019-11-11 LAB — RPR: RPR Ser Ql: NONREACTIVE

## 2019-11-11 LAB — HIV ANTIBODY (ROUTINE TESTING W REFLEX): HIV 1&2 Ab, 4th Generation: NONREACTIVE

## 2019-11-11 LAB — CHLAMYDIA PROBE AMP THINPREP: C. trachomatis RNA, TMA: NOT DETECTED

## 2019-11-16 ENCOUNTER — Encounter: Payer: 59 | Admitting: Women's Health

## 2020-04-25 ENCOUNTER — Ambulatory Visit: Payer: Self-pay | Attending: Internal Medicine

## 2020-04-25 DIAGNOSIS — Z20822 Contact with and (suspected) exposure to covid-19: Secondary | ICD-10-CM

## 2020-04-26 LAB — NOVEL CORONAVIRUS, NAA: SARS-CoV-2, NAA: NOT DETECTED

## 2020-04-26 LAB — SARS-COV-2, NAA 2 DAY TAT

## 2020-05-06 ENCOUNTER — Ambulatory Visit: Payer: Managed Care, Other (non HMO) | Admitting: Obstetrics and Gynecology

## 2020-05-06 ENCOUNTER — Ambulatory Visit: Payer: BC Managed Care – PPO | Admitting: Obstetrics and Gynecology

## 2020-05-06 ENCOUNTER — Other Ambulatory Visit: Payer: Self-pay

## 2020-05-06 ENCOUNTER — Encounter: Payer: Self-pay | Admitting: Obstetrics and Gynecology

## 2020-05-06 VITALS — BP 124/78

## 2020-05-06 DIAGNOSIS — N912 Amenorrhea, unspecified: Secondary | ICD-10-CM | POA: Diagnosis not present

## 2020-05-06 DIAGNOSIS — N898 Other specified noninflammatory disorders of vagina: Secondary | ICD-10-CM | POA: Diagnosis not present

## 2020-05-06 DIAGNOSIS — Z3201 Encounter for pregnancy test, result positive: Secondary | ICD-10-CM | POA: Diagnosis not present

## 2020-05-06 DIAGNOSIS — E039 Hypothyroidism, unspecified: Secondary | ICD-10-CM

## 2020-05-06 DIAGNOSIS — N760 Acute vaginitis: Secondary | ICD-10-CM | POA: Diagnosis not present

## 2020-05-06 DIAGNOSIS — I10 Essential (primary) hypertension: Secondary | ICD-10-CM

## 2020-05-06 DIAGNOSIS — B9689 Other specified bacterial agents as the cause of diseases classified elsewhere: Secondary | ICD-10-CM

## 2020-05-06 DIAGNOSIS — Z3A01 Less than 8 weeks gestation of pregnancy: Secondary | ICD-10-CM

## 2020-05-06 LAB — PREGNANCY, URINE: Preg Test, Ur: POSITIVE — AB

## 2020-05-06 LAB — WET PREP FOR TRICH, YEAST, CLUE

## 2020-05-06 MED ORDER — VITAMIN B-6 50 MG PO TABS: 50.0000 mg | ORAL_TABLET | Freq: Every day | ORAL | 1 refills | Status: DC

## 2020-05-06 MED ORDER — DOXYLAMINE SUCCINATE (SLEEP) 25 MG PO TABS: 25.0000 mg | ORAL_TABLET | Freq: Every evening | ORAL | 1 refills | Status: DC | PRN

## 2020-05-06 MED ORDER — LABETALOL HCL 100 MG PO TABS: 100.0000 mg | ORAL_TABLET | Freq: Two times a day (BID) | ORAL | 1 refills | Status: DC

## 2020-05-06 MED ORDER — METRONIDAZOLE 0.75 % VA GEL: 1.0000 | Freq: Every day | VAGINAL | 0 refills | Status: AC

## 2020-05-06 NOTE — Patient Instructions (Addendum)
Switch over to labetalol, stop the lisinopril, notify your primary doctor as well Try vitamin B6 2-3 x per day and Unisom 25-50 mg at bedtime for nausea Start taking a prenatal vitamin (with iron in it) Keep taking Synthroid Use the vaginal Metrogel to treat Bacterial vaginosis

## 2020-05-06 NOTE — Progress Notes (Addendum)
   Kathryn Buck Jun 19, 1986 [id number]  SUBJECTIVE:  34 y.o. G0P0 female presents for a positive pregnancy test at home.  She has not actively been trying to conceive but she has also not been on contraception for about the last 3 years.  She previously had a DVT when she was on the Ortho Evra patch in the early 2000's, and since then she has been on a number of different progestin only contraceptives.  She does have a history of abnormal/infrequent periods and anovulation.  LMP 03/18/2020, she more recently had been getting some breast tenderness and cramping like she typically does before the onset of periods so she thought she was about to start.  Different symptom was that she is starting to experience some nausea which is unusual for her with her period.  She checked a pregnancy test and it was positive.  She has not had any vaginal bleeding other than slight spotting that occurred a day after intercourse but no bleeding currently.  She is nervous and scared but welcomes the pregnancy.  She is having a bit of increased vaginal discharge and indicates she has a history of recurrent vaginitis.   Current Outpatient Medications  Medication Sig Dispense Refill  . levothyroxine (LEVOTHROID) 25 MCG tablet Take 1 tablet (25 mcg total) by mouth daily before breakfast. 90 tablet 4  . lisinopril (PRINIVIL,ZESTRIL) 10 MG tablet Take 10 mg by mouth daily.     No current facility-administered medications for this visit.   Allergies: Patient has no known allergies.  Patient's last menstrual period was 03/18/2020.  Past medical history,surgical history, problem list, medications, allergies, family history and social history were all reviewed and documented as reviewed in the EPIC chart.  ROS:  Feeling well. No dyspnea or chest pain on exertion.  No abdominal pain, change in bowel habits, black or bloody stools.  No urinary tract symptoms. GYN ROS: no pelvic pain or discharge, no breast pain or new or enlarging  lumps on self exam. No neurological complaints.   OBJECTIVE:  BP 124/78   LMP 03/18/2020  The patient appears well, alert, oriented x 3, in no distress. PELVIC EXAM: VULVA: normal appearing vulva with no masses, tenderness or lesions, VAGINA: normal appearing vagina with normal color and discharge, no lesions, CERVIX: normal appearing cervix without discharge or lesions, UTERUS: small, first trimester size, limited exam, WET MOUNT done - results: +clue cells +Urine pregnancy test  Chaperone: Caryn Bee present during the examination  ASSESSMENT:  34 y.o. G0P0 here with a positive pregnancy test, first trimester pregnancy  PLAN:  Congratulations are offered. She is encouraged to start taking a prenatal vitamin as she is currently only taking a multivitamin. I switched her antihypertensive to labetalol 100 mg twice daily and she will stop taking lisinopril. She will continue the Synthroid as is.  TSH is routinely monitored during the pregnancy. Pregnancy-induced nausea.  Prescription/recommendation given for vitamin B6 2-3 times per day, doxylamine 25 to 50 mg at bedtime.  Ginger also is helpful. Bacterial vaginosis.  Prescription for MetroGel x5 nights. I encouraged her to establish prenatal care soon as possible so she can obtain a dating ultrasound given her menstrual irregularity and uncertain EDC at this point.  As this is her first pregnancy and she has a history of a DVT, she would be a candidate to start baby aspirin preeclampsia prophylaxis in this pregnancy at 12 weeks.  Will defer discussion of anticoagulation to her obstetrician.   Joseph Pierini MD 05/06/20

## 2020-05-10 ENCOUNTER — Ambulatory Visit: Payer: BC Managed Care – PPO | Admitting: Nurse Practitioner

## 2020-05-20 LAB — OB RESULTS CONSOLE HIV ANTIBODY (ROUTINE TESTING): HIV: NONREACTIVE

## 2020-05-20 LAB — OB RESULTS CONSOLE RUBELLA ANTIBODY, IGM: Rubella: IMMUNE

## 2020-05-20 LAB — OB RESULTS CONSOLE HEPATITIS B SURFACE ANTIGEN: Hepatitis B Surface Ag: NEGATIVE

## 2020-05-25 LAB — OB RESULTS CONSOLE GC/CHLAMYDIA
Chlamydia: NEGATIVE
Gonorrhea: NEGATIVE

## 2020-05-30 ENCOUNTER — Other Ambulatory Visit: Payer: Self-pay | Admitting: Obstetrics and Gynecology

## 2020-06-08 ENCOUNTER — Encounter: Payer: Managed Care, Other (non HMO) | Attending: Obstetrics & Gynecology | Admitting: Registered"

## 2020-06-08 ENCOUNTER — Other Ambulatory Visit: Payer: Self-pay

## 2020-06-08 DIAGNOSIS — O99810 Abnormal glucose complicating pregnancy: Secondary | ICD-10-CM

## 2020-06-10 ENCOUNTER — Encounter: Payer: Self-pay | Admitting: Registered"

## 2020-06-10 DIAGNOSIS — O99810 Abnormal glucose complicating pregnancy: Secondary | ICD-10-CM | POA: Insufficient documentation

## 2020-06-10 NOTE — Progress Notes (Signed)
Patient was seen on 06/08/20 for Gestational Diabetes self-management class at the [facility]. The following learning objectives were met by the patient during this course:   States the definition of Gestational Diabetes  States why dietary management is important in controlling blood glucose  Describes the effects each nutrient has on blood glucose levels  Demonstrates ability to create a balanced meal plan  Demonstrates carbohydrate counting   States when to check blood glucose levels  Demonstrates proper blood glucose monitoring techniques  States the effect of stress and exercise on blood glucose levels  States the importance of limiting caffeine and abstaining from alcohol and smoking  Blood glucose monitor given: none  Patient instructed to monitor glucose levels: FBS: 60 - <95; 1 hour: <140; 2 hour: <120  Patient received handouts:  Nutrition Diabetes and Pregnancy, including carb counting list  Patient will be seen for follow-up as needed.

## 2020-06-13 ENCOUNTER — Other Ambulatory Visit: Payer: Self-pay | Admitting: Obstetrics and Gynecology

## 2020-06-13 NOTE — Telephone Encounter (Signed)
Pharmacy sent request for 90 days supply.

## 2020-07-13 ENCOUNTER — Other Ambulatory Visit: Payer: Self-pay | Admitting: Obstetrics & Gynecology

## 2020-07-13 DIAGNOSIS — Z3A16 16 weeks gestation of pregnancy: Secondary | ICD-10-CM

## 2020-07-13 DIAGNOSIS — O24119 Pre-existing diabetes mellitus, type 2, in pregnancy, unspecified trimester: Secondary | ICD-10-CM

## 2020-07-25 ENCOUNTER — Encounter: Payer: Self-pay | Admitting: *Deleted

## 2020-07-27 ENCOUNTER — Ambulatory Visit: Payer: Managed Care, Other (non HMO) | Attending: Obstetrics & Gynecology

## 2020-07-27 ENCOUNTER — Other Ambulatory Visit: Payer: Self-pay | Admitting: *Deleted

## 2020-07-27 ENCOUNTER — Other Ambulatory Visit: Payer: Self-pay

## 2020-07-27 ENCOUNTER — Ambulatory Visit: Payer: Managed Care, Other (non HMO) | Admitting: *Deleted

## 2020-07-27 VITALS — BP 123/78 | HR 90

## 2020-07-27 DIAGNOSIS — O99282 Endocrine, nutritional and metabolic diseases complicating pregnancy, second trimester: Secondary | ICD-10-CM

## 2020-07-27 DIAGNOSIS — O99212 Obesity complicating pregnancy, second trimester: Secondary | ICD-10-CM | POA: Diagnosis not present

## 2020-07-27 DIAGNOSIS — O24119 Pre-existing diabetes mellitus, type 2, in pregnancy, unspecified trimester: Secondary | ICD-10-CM | POA: Insufficient documentation

## 2020-07-27 DIAGNOSIS — Z363 Encounter for antenatal screening for malformations: Secondary | ICD-10-CM | POA: Diagnosis not present

## 2020-07-27 DIAGNOSIS — O09519 Supervision of elderly primigravida, unspecified trimester: Secondary | ICD-10-CM | POA: Diagnosis present

## 2020-07-27 DIAGNOSIS — O24312 Unspecified pre-existing diabetes mellitus in pregnancy, second trimester: Secondary | ICD-10-CM | POA: Diagnosis not present

## 2020-07-27 DIAGNOSIS — E669 Obesity, unspecified: Secondary | ICD-10-CM

## 2020-07-27 DIAGNOSIS — O10919 Unspecified pre-existing hypertension complicating pregnancy, unspecified trimester: Secondary | ICD-10-CM

## 2020-07-27 DIAGNOSIS — O10012 Pre-existing essential hypertension complicating pregnancy, second trimester: Secondary | ICD-10-CM | POA: Diagnosis not present

## 2020-07-27 DIAGNOSIS — E039 Hypothyroidism, unspecified: Secondary | ICD-10-CM

## 2020-07-27 DIAGNOSIS — Z3A16 16 weeks gestation of pregnancy: Secondary | ICD-10-CM

## 2020-07-27 DIAGNOSIS — Z3A18 18 weeks gestation of pregnancy: Secondary | ICD-10-CM

## 2020-07-29 ENCOUNTER — Ambulatory Visit: Payer: Managed Care, Other (non HMO)

## 2020-08-24 ENCOUNTER — Other Ambulatory Visit: Payer: Self-pay

## 2020-08-24 ENCOUNTER — Ambulatory Visit: Payer: Managed Care, Other (non HMO) | Attending: Obstetrics and Gynecology

## 2020-08-24 ENCOUNTER — Other Ambulatory Visit: Payer: Self-pay | Admitting: *Deleted

## 2020-08-24 ENCOUNTER — Ambulatory Visit: Payer: Managed Care, Other (non HMO) | Admitting: *Deleted

## 2020-08-24 VITALS — BP 108/56 | HR 69 | Wt 224.0 lb

## 2020-08-24 DIAGNOSIS — E039 Hypothyroidism, unspecified: Secondary | ICD-10-CM

## 2020-08-24 DIAGNOSIS — O10912 Unspecified pre-existing hypertension complicating pregnancy, second trimester: Secondary | ICD-10-CM | POA: Diagnosis not present

## 2020-08-24 DIAGNOSIS — O321XX0 Maternal care for breech presentation, not applicable or unspecified: Secondary | ICD-10-CM | POA: Diagnosis not present

## 2020-08-24 DIAGNOSIS — O24312 Unspecified pre-existing diabetes mellitus in pregnancy, second trimester: Secondary | ICD-10-CM | POA: Diagnosis not present

## 2020-08-24 DIAGNOSIS — O99282 Endocrine, nutritional and metabolic diseases complicating pregnancy, second trimester: Secondary | ICD-10-CM | POA: Diagnosis not present

## 2020-08-24 DIAGNOSIS — Z362 Encounter for other antenatal screening follow-up: Secondary | ICD-10-CM

## 2020-08-24 DIAGNOSIS — O10919 Unspecified pre-existing hypertension complicating pregnancy, unspecified trimester: Secondary | ICD-10-CM | POA: Insufficient documentation

## 2020-08-24 DIAGNOSIS — Z3A22 22 weeks gestation of pregnancy: Secondary | ICD-10-CM

## 2020-08-24 DIAGNOSIS — Z7984 Long term (current) use of oral hypoglycemic drugs: Secondary | ICD-10-CM

## 2020-08-24 DIAGNOSIS — Z363 Encounter for antenatal screening for malformations: Secondary | ICD-10-CM

## 2020-09-23 ENCOUNTER — Ambulatory Visit: Payer: Managed Care, Other (non HMO)

## 2020-10-20 ENCOUNTER — Ambulatory Visit: Payer: Managed Care, Other (non HMO) | Attending: Obstetrics and Gynecology | Admitting: *Deleted

## 2020-10-20 ENCOUNTER — Ambulatory Visit (HOSPITAL_BASED_OUTPATIENT_CLINIC_OR_DEPARTMENT_OTHER): Payer: Managed Care, Other (non HMO)

## 2020-10-20 ENCOUNTER — Other Ambulatory Visit: Payer: Self-pay

## 2020-10-20 VITALS — BP 125/71 | HR 101

## 2020-10-20 DIAGNOSIS — O99283 Endocrine, nutritional and metabolic diseases complicating pregnancy, third trimester: Secondary | ICD-10-CM | POA: Insufficient documentation

## 2020-10-20 DIAGNOSIS — O10013 Pre-existing essential hypertension complicating pregnancy, third trimester: Secondary | ICD-10-CM

## 2020-10-20 DIAGNOSIS — Z363 Encounter for antenatal screening for malformations: Secondary | ICD-10-CM | POA: Diagnosis not present

## 2020-10-20 DIAGNOSIS — O99213 Obesity complicating pregnancy, third trimester: Secondary | ICD-10-CM | POA: Insufficient documentation

## 2020-10-20 DIAGNOSIS — E039 Hypothyroidism, unspecified: Secondary | ICD-10-CM

## 2020-10-20 DIAGNOSIS — O10913 Unspecified pre-existing hypertension complicating pregnancy, third trimester: Secondary | ICD-10-CM | POA: Diagnosis not present

## 2020-10-20 DIAGNOSIS — Z362 Encounter for other antenatal screening follow-up: Secondary | ICD-10-CM | POA: Insufficient documentation

## 2020-10-20 DIAGNOSIS — E119 Type 2 diabetes mellitus without complications: Secondary | ICD-10-CM

## 2020-10-20 DIAGNOSIS — Z3A30 30 weeks gestation of pregnancy: Secondary | ICD-10-CM

## 2020-10-20 DIAGNOSIS — E669 Obesity, unspecified: Secondary | ICD-10-CM

## 2020-10-20 DIAGNOSIS — O10919 Unspecified pre-existing hypertension complicating pregnancy, unspecified trimester: Secondary | ICD-10-CM

## 2020-10-20 DIAGNOSIS — O24313 Unspecified pre-existing diabetes mellitus in pregnancy, third trimester: Secondary | ICD-10-CM

## 2020-10-20 DIAGNOSIS — O24113 Pre-existing diabetes mellitus, type 2, in pregnancy, third trimester: Secondary | ICD-10-CM | POA: Insufficient documentation

## 2020-10-21 ENCOUNTER — Other Ambulatory Visit: Payer: Self-pay | Admitting: *Deleted

## 2020-10-21 DIAGNOSIS — O09519 Supervision of elderly primigravida, unspecified trimester: Secondary | ICD-10-CM

## 2020-11-18 ENCOUNTER — Other Ambulatory Visit: Payer: Self-pay | Admitting: Obstetrics

## 2020-11-18 ENCOUNTER — Encounter: Payer: Self-pay | Admitting: *Deleted

## 2020-11-18 ENCOUNTER — Ambulatory Visit: Payer: Managed Care, Other (non HMO) | Attending: Obstetrics and Gynecology

## 2020-11-18 ENCOUNTER — Other Ambulatory Visit: Payer: Self-pay

## 2020-11-18 ENCOUNTER — Other Ambulatory Visit: Payer: Self-pay | Admitting: *Deleted

## 2020-11-18 ENCOUNTER — Ambulatory Visit: Payer: Managed Care, Other (non HMO) | Admitting: *Deleted

## 2020-11-18 VITALS — BP 114/72 | HR 90

## 2020-11-18 DIAGNOSIS — O99213 Obesity complicating pregnancy, third trimester: Secondary | ICD-10-CM | POA: Diagnosis not present

## 2020-11-18 DIAGNOSIS — Z3A35 35 weeks gestation of pregnancy: Secondary | ICD-10-CM

## 2020-11-18 DIAGNOSIS — O09519 Supervision of elderly primigravida, unspecified trimester: Secondary | ICD-10-CM | POA: Diagnosis not present

## 2020-11-18 DIAGNOSIS — O24313 Unspecified pre-existing diabetes mellitus in pregnancy, third trimester: Secondary | ICD-10-CM | POA: Diagnosis not present

## 2020-11-18 DIAGNOSIS — O99283 Endocrine, nutritional and metabolic diseases complicating pregnancy, third trimester: Secondary | ICD-10-CM

## 2020-11-18 DIAGNOSIS — O24113 Pre-existing diabetes mellitus, type 2, in pregnancy, third trimester: Secondary | ICD-10-CM

## 2020-11-18 DIAGNOSIS — O10913 Unspecified pre-existing hypertension complicating pregnancy, third trimester: Secondary | ICD-10-CM

## 2020-11-18 DIAGNOSIS — E039 Hypothyroidism, unspecified: Secondary | ICD-10-CM

## 2020-12-06 ENCOUNTER — Encounter (HOSPITAL_COMMUNITY): Payer: Self-pay | Admitting: *Deleted

## 2020-12-06 ENCOUNTER — Telehealth (HOSPITAL_COMMUNITY): Payer: Self-pay | Admitting: *Deleted

## 2020-12-06 NOTE — Telephone Encounter (Signed)
Preadmission screen  

## 2020-12-08 ENCOUNTER — Ambulatory Visit: Payer: Managed Care, Other (non HMO)

## 2020-12-12 ENCOUNTER — Other Ambulatory Visit (HOSPITAL_COMMUNITY)
Admission: RE | Admit: 2020-12-12 | Discharge: 2020-12-12 | Disposition: A | Discharge status: Home / Self Care | Payer: Managed Care, Other (non HMO) | Source: Ambulatory Visit | Attending: Obstetrics and Gynecology | Admitting: Obstetrics and Gynecology

## 2020-12-12 DIAGNOSIS — Z01812 Encounter for preprocedural laboratory examination: Secondary | ICD-10-CM | POA: Insufficient documentation

## 2020-12-12 DIAGNOSIS — Z20822 Contact with and (suspected) exposure to covid-19: Secondary | ICD-10-CM | POA: Insufficient documentation

## 2020-12-12 LAB — SARS CORONAVIRUS 2 (TAT 6-24 HRS): SARS Coronavirus 2: NEGATIVE

## 2020-12-13 ENCOUNTER — Other Ambulatory Visit: Payer: Self-pay | Admitting: Obstetrics and Gynecology

## 2020-12-13 LAB — OB RESULTS CONSOLE GBS: GBS: POSITIVE

## 2020-12-13 NOTE — H&P (Signed)
Kathryn Buck is a 34 y.o. femaleG1P0 at 83 wks and 5 days  presenting for induction of labor due to chronic hyperension / type 2 dm . 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 Prenatal care provided by Dr. Christophe Louis and Dr. Drema Dallas with [facility].   .U/S for efw on 11/18/2020 was 4 lbs 13 oz ( 10%ile).Marland Kitchen Anterior placenta    OB History    Gravida  1   Para  0   Term      Preterm      AB      Living  0     SAB      IAB      Ectopic      Multiple      Live Births             Past Medical History:  Diagnosis Date  . Diabetes mellitus without complication (HCC)    Type 2  . DVT (deep venous thrombosis) ([place])   . Gestational diabetes    metformin  . History of chlamydia   . History of gonorrhea   . Hypertension    labetalol   . Hypothyroidism    synthroid  . STD (sexually transmitted disease)   . Vaginal Pap smear, abnormal    Past Surgical History:  Procedure Laterality Date  . EXPLORATORY LAPAROTOMY     left ovarian cystectomy dermoid 2014  . LAPAROTOMY Left 08/10/2013   Procedure: EXPLORATORY LAPAROTOMY; LEFT OVARIAN CYSTECTOMY;  Surgeon: Terrance Mass, MD;  Location: [facility];  Service: Gynecology;  Laterality: Left;  . OVARIAN CYST SURGERY    . WISDOM TOOTH EXTRACTION     Family History: family history includes Diabetes in her mother and sister; Heart disease in her mother; Hypertension in her father and mother. Social History:  reports that she has never smoked. She has never used smokeless tobacco. She reports current alcohol use. She reports that she does not use drugs.     Maternal Diabetes: Yes:  Diabetes Type:  Pre-pregnancy Genetic Screening: Normal Maternal Ultrasounds/Referrals: Normal Fetal Ultrasounds or other Referrals:  None Maternal Substance Abuse:  No Significant Maternal Medications:  Meds include: Syntroid Significant Maternal Lab Results:  Group B Strep positive Other  Comments:  None  Review of Systems  Constitutional: Negative.   HENT: Negative.   Eyes: Negative.   Respiratory: Negative.   Cardiovascular: Negative.   Gastrointestinal: Negative.   Endocrine: Negative.   Genitourinary: Negative.   Musculoskeletal: Negative.   Skin: Negative.   Allergic/Immunologic: Negative.   Neurological: Negative.   Hematological: Negative.   Psychiatric/Behavioral: Negative.   All other systems reviewed and are negative.  History   Last menstrual period 03/18/2020. Maternal Exam:  Introitus: Normal vulva.   Physical Exam Vitals reviewed.  Constitutional:      Appearance: Normal appearance.  HENT:     Head: Normocephalic and atraumatic.  Cardiovascular:     Rate and Rhythm: Normal rate and regular rhythm.     Pulses: Normal pulses.     Heart sounds: Normal heart sounds.  Pulmonary:     Effort: Pulmonary effort is normal.     Breath sounds: Normal breath sounds.  Abdominal:     Tenderness: There is no abdominal tenderness.  Genitourinary:    General: Normal vulva.  Musculoskeletal:        General: Swelling present. Normal range of motion.     Cervical back: Normal range of motion.  Skin:    General: Skin is warm and dry.  Neurological:     General: No focal deficit present.     Mental Status: She is alert and oriented to person, place, and time.  Psychiatric:        Mood and Affect: Mood normal.        Behavior: Behavior normal.    Cervix 1.5/75/-2   Prenatal labs: ABO, Rh:  B positive  Antibody:   Negative  Rubella:  Immune  RPR:   Negative  HBsAg:  Negative   HIV:   Negative  GBS:   Positive   Assessment/Plan: 38 wks and 5 days for induction due to chornic hypertension / type 2 dm  - cytotec for cervical ripening CHTN- continue labetalol 200 mg bid  Type 2 DM will hold metformin until after delviery. 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

## 2020-12-13 NOTE — H&P (Deleted)
  The note originally documented on this encounter has been moved the the encounter in which it belongs.  

## 2020-12-14 ENCOUNTER — Inpatient Hospital Stay (HOSPITAL_COMMUNITY): Payer: Managed Care, Other (non HMO) | Admitting: Anesthesiology

## 2020-12-14 ENCOUNTER — Inpatient Hospital Stay (HOSPITAL_COMMUNITY): Payer: Managed Care, Other (non HMO)

## 2020-12-14 ENCOUNTER — Inpatient Hospital Stay (HOSPITAL_COMMUNITY)
Admission: AD | Admit: 2020-12-14 | Discharge: 2020-12-17 | DRG: 786 | Disposition: A | Discharge status: Home / Self Care | Payer: Managed Care, Other (non HMO) | Attending: Obstetrics and Gynecology | Admitting: Obstetrics and Gynecology

## 2020-12-14 ENCOUNTER — Encounter (HOSPITAL_COMMUNITY): Payer: Self-pay | Admitting: Obstetrics and Gynecology

## 2020-12-14 ENCOUNTER — Other Ambulatory Visit: Payer: Self-pay

## 2020-12-14 DIAGNOSIS — D62 Acute posthemorrhagic anemia: Secondary | ICD-10-CM | POA: Diagnosis not present

## 2020-12-14 DIAGNOSIS — E119 Type 2 diabetes mellitus without complications: Secondary | ICD-10-CM | POA: Diagnosis present

## 2020-12-14 DIAGNOSIS — O9832 Other infections with a predominantly sexual mode of transmission complicating childbirth: Secondary | ICD-10-CM | POA: Diagnosis present

## 2020-12-14 DIAGNOSIS — A6000 Herpesviral infection of urogenital system, unspecified: Secondary | ICD-10-CM | POA: Diagnosis present

## 2020-12-14 DIAGNOSIS — Z3A38 38 weeks gestation of pregnancy: Secondary | ICD-10-CM

## 2020-12-14 DIAGNOSIS — O9081 Anemia of the puerperium: Secondary | ICD-10-CM | POA: Diagnosis not present

## 2020-12-14 DIAGNOSIS — O1002 Pre-existing essential hypertension complicating childbirth: Principal | ICD-10-CM | POA: Diagnosis present

## 2020-12-14 DIAGNOSIS — O99824 Streptococcus B carrier state complicating childbirth: Secondary | ICD-10-CM | POA: Diagnosis present

## 2020-12-14 DIAGNOSIS — O2412 Pre-existing diabetes mellitus, type 2, in childbirth: Secondary | ICD-10-CM | POA: Diagnosis present

## 2020-12-14 DIAGNOSIS — O24113 Pre-existing diabetes mellitus, type 2, in pregnancy, third trimester: Secondary | ICD-10-CM | POA: Diagnosis present

## 2020-12-14 DIAGNOSIS — E039 Hypothyroidism, unspecified: Secondary | ICD-10-CM | POA: Diagnosis present

## 2020-12-14 DIAGNOSIS — O99214 Obesity complicating childbirth: Secondary | ICD-10-CM | POA: Diagnosis present

## 2020-12-14 DIAGNOSIS — Z20822 Contact with and (suspected) exposure to covid-19: Secondary | ICD-10-CM | POA: Diagnosis present

## 2020-12-14 DIAGNOSIS — O99284 Endocrine, nutritional and metabolic diseases complicating childbirth: Secondary | ICD-10-CM | POA: Diagnosis present

## 2020-12-14 DIAGNOSIS — Z7984 Long term (current) use of oral hypoglycemic drugs: Secondary | ICD-10-CM | POA: Diagnosis not present

## 2020-12-14 DIAGNOSIS — Z86718 Personal history of other venous thrombosis and embolism: Secondary | ICD-10-CM | POA: Diagnosis not present

## 2020-12-14 LAB — CBC
HCT: 35.3 % — ABNORMAL LOW (ref 36.0–46.0)
Hemoglobin: 11.6 g/dL — ABNORMAL LOW (ref 12.0–15.0)
MCH: 27.4 pg (ref 26.0–34.0)
MCHC: 32.9 g/dL (ref 30.0–36.0)
MCV: 83.3 fL (ref 80.0–100.0)
Platelets: 384 10*3/uL (ref 150–400)
RBC: 4.24 MIL/uL (ref 3.87–5.11)
RDW: 14.7 % (ref 11.5–15.5)
WBC: 9.9 10*3/uL (ref 4.0–10.5)
nRBC: 0 % (ref 0.0–0.2)

## 2020-12-14 LAB — COMPREHENSIVE METABOLIC PANEL
ALT: 31 U/L (ref 0–44)
AST: 23 U/L (ref 15–41)
Albumin: 2.8 g/dL — ABNORMAL LOW (ref 3.5–5.0)
Alkaline Phosphatase: 141 U/L — ABNORMAL HIGH (ref 38–126)
Anion gap: 10 (ref 5–15)
BUN: 11 mg/dL (ref 6–20)
CO2: 22 mmol/L (ref 22–32)
Calcium: 9.2 mg/dL (ref 8.9–10.3)
Chloride: 105 mmol/L (ref 98–111)
Creatinine, Ser: 0.71 mg/dL (ref 0.44–1.00)
GFR, Estimated: >60 mL/min (ref >60)
Glucose, Bld: 137 mg/dL — ABNORMAL HIGH (ref 70–99)
Potassium: 3.7 mmol/L (ref 3.5–5.1)
Sodium: 137 mmol/L (ref 135–145)
Total Bilirubin: 0.3 mg/dL (ref 0.3–1.2)
Total Protein: 6.2 g/dL — ABNORMAL LOW (ref 6.5–8.1)

## 2020-12-14 LAB — GLUCOSE, CAPILLARY: Glucose-Capillary: 130 mg/dL — ABNORMAL HIGH (ref 70–99)

## 2020-12-14 LAB — TYPE AND SCREEN
ABO/RH(D): B POS
Antibody Screen: NEGATIVE

## 2020-12-14 LAB — RPR: RPR Ser Ql: NONREACTIVE

## 2020-12-14 MED ORDER — EPHEDRINE 5 MG/ML INJ: 10.0000 mg | INTRAVENOUS | Status: DC | PRN

## 2020-12-14 MED ORDER — ONDANSETRON HCL 4 MG/2ML IJ SOLN
4.0000 mg | Freq: Four times a day (QID) | INTRAMUSCULAR | Status: DC | PRN
  Administered 2020-12-14: 4 mg via INTRAVENOUS
  Filled 2020-12-14: qty 2

## 2020-12-14 MED ORDER — OXYCODONE-ACETAMINOPHEN 5-325 MG PO TABS: 2.0000 | ORAL_TABLET | ORAL | Status: DC | PRN

## 2020-12-14 MED ORDER — LEVOTHYROXINE SODIUM 50 MCG PO TABS
25.0000 ug | ORAL_TABLET | Freq: Every day | ORAL | Status: DC
  Administered 2020-12-14 – 2020-12-15 (×2): 25 ug via ORAL
  Filled 2020-12-14 (×3): qty 1

## 2020-12-14 MED ORDER — LACTATED RINGERS IV SOLN: 500.0000 mL | Freq: Once | INTRAVENOUS | Status: DC

## 2020-12-14 MED ORDER — TERBUTALINE SULFATE 1 MG/ML IJ SOLN: 0.2500 mg | Freq: Once | INTRAMUSCULAR | Status: DC | PRN

## 2020-12-14 MED ORDER — LACTATED RINGERS AMNIOINFUSION: INTRAVENOUS | Status: DC

## 2020-12-14 MED ORDER — OXYTOCIN-SODIUM CHLORIDE 30-0.9 UT/500ML-% IV SOLN
2.5000 [IU]/h | INTRAVENOUS | Status: DC
  Administered 2020-12-15: 400 m[IU]/min via INTRAVENOUS

## 2020-12-14 MED ORDER — SODIUM CHLORIDE (PF) 0.9 % IJ SOLN
INTRAMUSCULAR | Status: DC | PRN
  Administered 2020-12-14: 12 mL/h via EPIDURAL

## 2020-12-14 MED ORDER — OXYTOCIN BOLUS FROM INFUSION: 333.0000 mL | Freq: Once | INTRAVENOUS | Status: DC

## 2020-12-14 MED ORDER — FENTANYL CITRATE (PF) 100 MCG/2ML IJ SOLN
50.0000 ug | INTRAMUSCULAR | Status: DC | PRN
  Administered 2020-12-14 (×3): 100 ug via INTRAVENOUS
  Filled 2020-12-14 (×3): qty 2

## 2020-12-14 MED ORDER — OXYTOCIN-SODIUM CHLORIDE 30-0.9 UT/500ML-% IV SOLN
1.0000 m[IU]/min | INTRAVENOUS | Status: DC
  Administered 2020-12-14: 2 m[IU]/min via INTRAVENOUS
  Filled 2020-12-14: qty 500

## 2020-12-14 MED ORDER — PENICILLIN G POT IN DEXTROSE 60000 UNIT/ML IV SOLN
3.0000 10*6.[IU] | INTRAVENOUS | Status: DC
  Administered 2020-12-14 – 2020-12-15 (×6): 3 10*6.[IU] via INTRAVENOUS
  Filled 2020-12-14 (×6): qty 50

## 2020-12-14 MED ORDER — ACETAMINOPHEN 325 MG PO TABS: 650.0000 mg | ORAL_TABLET | ORAL | Status: DC | PRN

## 2020-12-14 MED ORDER — LACTATED RINGERS IV SOLN: INTRAVENOUS | Status: DC

## 2020-12-14 MED ORDER — LABETALOL HCL 200 MG PO TABS
200.0000 mg | ORAL_TABLET | Freq: Two times a day (BID) | ORAL | Status: DC
  Administered 2020-12-14 (×3): 200 mg via ORAL
  Filled 2020-12-14 (×3): qty 1

## 2020-12-14 MED ORDER — SOD CITRATE-CITRIC ACID 500-334 MG/5ML PO SOLN
30.0000 mL | ORAL | Status: DC | PRN
  Administered 2020-12-14 – 2020-12-15 (×2): 30 mL via ORAL
  Filled 2020-12-14 (×2): qty 15

## 2020-12-14 MED ORDER — LIDOCAINE HCL (PF) 1 % IJ SOLN
INTRAMUSCULAR | Status: DC | PRN
  Administered 2020-12-14 (×2): 5 mL via EPIDURAL

## 2020-12-14 MED ORDER — FENTANYL-BUPIVACAINE-NACL 0.5-0.125-0.9 MG/250ML-% EP SOLN
12.0000 mL/h | EPIDURAL | Status: DC | PRN
  Filled 2020-12-14: qty 250

## 2020-12-14 MED ORDER — MISOPROSTOL 25 MCG QUARTER TABLET
25.0000 ug | ORAL_TABLET | ORAL | Status: DC | PRN
  Administered 2020-12-14 (×2): 25 ug via VAGINAL
  Filled 2020-12-14 (×2): qty 1

## 2020-12-14 MED ORDER — LIDOCAINE HCL (PF) 1 % IJ SOLN: 30.0000 mL | INTRAMUSCULAR | Status: DC | PRN

## 2020-12-14 MED ORDER — DIPHENHYDRAMINE HCL 50 MG/ML IJ SOLN: 12.5000 mg | INTRAMUSCULAR | Status: DC | PRN

## 2020-12-14 MED ORDER — SODIUM CHLORIDE 0.9 % IV SOLN
5.0000 10*6.[IU] | Freq: Once | INTRAVENOUS | Status: AC
  Administered 2020-12-14: 5 10*6.[IU] via INTRAVENOUS
  Filled 2020-12-14: qty 5

## 2020-12-14 MED ORDER — OXYCODONE-ACETAMINOPHEN 5-325 MG PO TABS
1.0000 | ORAL_TABLET | ORAL | Status: DC | PRN
Start: 2020-12-14 — End: 2020-12-15

## 2020-12-14 MED ORDER — PHENYLEPHRINE 40 MCG/ML (10ML) SYRINGE FOR IV PUSH (FOR BLOOD PRESSURE SUPPORT): 80.0000 ug | PREFILLED_SYRINGE | INTRAVENOUS | Status: DC | PRN

## 2020-12-14 MED ORDER — HYDROXYZINE HCL 50 MG PO TABS: 50.0000 mg | ORAL_TABLET | Freq: Four times a day (QID) | ORAL | Status: DC | PRN

## 2020-12-14 MED ORDER — LACTATED RINGERS IV SOLN: 500.0000 mL | INTRAVENOUS | Status: DC | PRN

## 2020-12-14 MED ORDER — PHENYLEPHRINE 40 MCG/ML (10ML) SYRINGE FOR IV PUSH (FOR BLOOD PRESSURE SUPPORT)
80.0000 ug | PREFILLED_SYRINGE | INTRAVENOUS | Status: DC | PRN
  Filled 2020-12-14: qty 10

## 2020-12-14 NOTE — Progress Notes (Signed)
OB Progress Note  S: Patient resting comfortably with epidural.  Agreeable to AROM.  O: BP 110/65   Pulse 86   Temp 97.8 F (36.6 C)   Resp 17   Ht 5\' 2"  (1.575 m)   Wt 103.6 kg   LMP 03/18/2020   BMI 41.77 kg/m   FHT: 135bpm, moderate variablity, - accels, - decels  Prior arrival to room, FHT with variable decelerations and occasional late decelerations Toco: not graphing well SVE: 5/90/-2, sutures palpated AROM:  Clear, non-odorous IUPC and FSE placed due to difficulty graphing contractions  A/P: 34 y.o. G1P0  @ [redacted]w[redacted]d admitted for IOL - cHTN and pre-gestational type II DM.  FWB: Cat. II Labor course: Pitocin at 43mU/min, AROM performed with IUPC and FSE placement Pain: Epidural GBS: Receiving PCN  Returned to room ~2200 to readjust IUPC.  9m, DO [phone] (office)

## 2020-12-14 NOTE — Progress Notes (Addendum)
   Subjective: Patient reports painful contractions. She desires an epidural +FM no lof no vaginal bleeding   Objective: BP 124/75 (BP Location: Left Arm)   Pulse 83   Temp 97.8 F (36.6 C)   Resp 17   Ht 5\' 2"  (1.575 m)   Wt 103.6 kg   LMP 03/18/2020   BMI 41.77 kg/m  No intake/output data recorded. No intake/output data recorded.  FHT:  FHR: 130 bpm, variability: moderate,  accelerations:  Present,  decelerations:  Absent UC:   Not graphing well  Pt on 10 mU of pitocin  SVE:   Dilation: 3.5 Effacement (%): 70 Station: -2 Exam by:: [email or web address] RN  Labs: Lab Results  Component Value Date   WBC 9.9 12/14/2020   HGB 11.6 (L) 12/14/2020   HCT 35.3 (L) 12/14/2020   MCV 83.3 12/14/2020   PLT 384 12/14/2020    Assessment / Plan: Induction of labor due to chronic hypertension ,  progressing well on pitocin  Labor: Progressing on Pitocin, will continue to increase then AROM Preeclampsia:  labs stable Fetal Wellbeing:  Category I Pain Control:  planning an epidural  I/D:  Penicillin Anticipated MOD:  NSVD  Dr. 12/16/2020 covering after 630 pm   Connye Burkitt 12/14/2020, 6:23 PM

## 2020-12-14 NOTE — Anesthesia Procedure Notes (Signed)
Epidural Patient location during procedure: OB Start time: 12/14/2020 6:37 PM End time: 12/14/2020 6:47 PM  Staffing Anesthesiologist: Leonides Grills, MD Performed: anesthesiologist   Preanesthetic Checklist Completed: patient identified, IV checked, site marked, risks and benefits discussed, monitors and equipment checked, pre-op evaluation and timeout performed  Epidural Patient position: sitting Prep: DuraPrep Patient monitoring: heart rate, cardiac monitor, continuous pulse ox and blood pressure Approach: midline Location: L4-L5 Injection technique: LOR air  Needle:  Needle type: Tuohy  Needle gauge: 17 G Needle length: 9 cm Needle insertion depth: 7 cm Catheter type: closed end flexible Catheter size: 19 Gauge Catheter at skin depth: 13 cm Test dose: negative and Other  Assessment Events: blood not aspirated, injection not painful, no injection resistance and negative IV test  Additional Notes Informed consent obtained prior to proceeding including risk of failure, 1% risk of PDPH, risk of minor discomfort and bruising. Discussed alternatives to epidural analgesia and patient desires to proceed.  Timeout performed pre-procedure verifying patient name, procedure, and platelet count.  Patient tolerated procedure well. Reason for block:procedure for pain

## 2020-12-14 NOTE — Anesthesia Preprocedure Evaluation (Signed)
Anesthesia Evaluation  Patient identified by MRN, date of birth, ID band Patient awake    Reviewed: Allergy & Precautions, H&P , NPO status , Patient's Chart, lab work & pertinent test results  History of Anesthesia Complications Negative for: history of anesthetic complications  Airway Mallampati: II  TM Distance: >3 FB Neck ROM: full    Dental no notable dental hx. (+) Teeth Intact   Pulmonary neg pulmonary ROS,    Pulmonary exam normal breath sounds clear to auscultation       Cardiovascular hypertension, Pt. on home beta blockers and Pt. on medications + DVT  Normal cardiovascular exam Rhythm:regular Rate:Normal     Neuro/Psych negative neurological ROS  negative psych ROS   GI/Hepatic negative GI ROS, Neg liver ROS,   Endo/Other  diabetes, Gestational, Oral Hypoglycemic AgentsHypothyroidism Morbid obesity  Renal/GU negative Renal ROS  negative genitourinary   Musculoskeletal   Abdominal (+) + obese,   Peds  Hematology  (+) Blood dyscrasia, anemia ,   Anesthesia Other Findings   Reproductive/Obstetrics (+) Pregnancy                             Anesthesia Physical Anesthesia Plan  ASA: III  Anesthesia Plan: Epidural   Post-op Pain Management:    Induction:   PONV Risk Score and Plan:   Airway Management Planned:   Additional Equipment:   Intra-op Plan:   Post-operative Plan:   Informed Consent: I have reviewed the patients History and Physical, chart, labs and discussed the procedure including the risks, benefits and alternatives for the proposed anesthesia with the patient or authorized representative who has indicated his/her understanding and acceptance.       Plan Discussed with:   Anesthesia Plan Comments:         Anesthesia Quick Evaluation

## 2020-12-15 ENCOUNTER — Encounter (HOSPITAL_COMMUNITY): Payer: Self-pay | Admitting: Obstetrics and Gynecology

## 2020-12-15 ENCOUNTER — Encounter (HOSPITAL_COMMUNITY)
Admission: AD | Disposition: A | Discharge status: Home / Self Care | Payer: Self-pay | Source: Home / Self Care | Attending: Obstetrics and Gynecology

## 2020-12-15 LAB — CBC
HCT: 33.9 % — ABNORMAL LOW (ref 36.0–46.0)
Hemoglobin: 11.5 g/dL — ABNORMAL LOW (ref 12.0–15.0)
MCH: 28.3 pg (ref 26.0–34.0)
MCHC: 33.9 g/dL (ref 30.0–36.0)
MCV: 83.5 fL (ref 80.0–100.0)
Platelets: 372 10*3/uL (ref 150–400)
RBC: 4.06 MIL/uL (ref 3.87–5.11)
RDW: 14.7 % (ref 11.5–15.5)
WBC: 20.4 10*3/uL — ABNORMAL HIGH (ref 4.0–10.5)
nRBC: 0 % (ref 0.0–0.2)

## 2020-12-15 LAB — GLUCOSE, CAPILLARY
Glucose-Capillary: 117 mg/dL — ABNORMAL HIGH (ref 70–99)
Glucose-Capillary: 119 mg/dL — ABNORMAL HIGH (ref 70–99)
Glucose-Capillary: 124 mg/dL — ABNORMAL HIGH (ref 70–99)
Glucose-Capillary: 134 mg/dL — ABNORMAL HIGH (ref 70–99)
Glucose-Capillary: 154 mg/dL — ABNORMAL HIGH (ref 70–99)
Glucose-Capillary: 160 mg/dL — ABNORMAL HIGH (ref 70–99)
Glucose-Capillary: 98 mg/dL (ref 70–99)

## 2020-12-15 SURGERY — Surgical Case
Anesthesia: Epidural

## 2020-12-15 MED ORDER — MENTHOL 3 MG MT LOZG: 1.0000 | LOZENGE | OROMUCOSAL | Status: DC | PRN

## 2020-12-15 MED ORDER — TETANUS-DIPHTH-ACELL PERTUSSIS 5-2.5-18.5 LF-MCG/0.5 IM SUSY: 0.5000 mL | PREFILLED_SYRINGE | Freq: Once | INTRAMUSCULAR | Status: DC

## 2020-12-15 MED ORDER — DEXTROSE 5 % IV SOLN
3.0000 g | INTRAVENOUS | Status: AC
  Administered 2020-12-15: 3 g via INTRAVENOUS
  Filled 2020-12-15: qty 3000

## 2020-12-15 MED ORDER — DIPHENHYDRAMINE HCL 25 MG PO CAPS
25.0000 mg | ORAL_CAPSULE | Freq: Four times a day (QID) | ORAL | Status: DC | PRN
  Administered 2020-12-15: 25 mg via ORAL
  Filled 2020-12-15: qty 1

## 2020-12-15 MED ORDER — OXYTOCIN-SODIUM CHLORIDE 30-0.9 UT/500ML-% IV SOLN
INTRAVENOUS | Status: AC
  Filled 2020-12-15: qty 500

## 2020-12-15 MED ORDER — SIMETHICONE 80 MG PO CHEW
80.0000 mg | CHEWABLE_TABLET | ORAL | Status: DC | PRN
  Administered 2020-12-15: 80 mg via ORAL
  Filled 2020-12-15: qty 1

## 2020-12-15 MED ORDER — IBUPROFEN 800 MG PO TABS
800.0000 mg | ORAL_TABLET | Freq: Three times a day (TID) | ORAL | Status: DC
  Administered 2020-12-15 – 2020-12-17 (×5): 800 mg via ORAL
  Filled 2020-12-15 (×6): qty 1

## 2020-12-15 MED ORDER — PRENATAL MULTIVITAMIN CH
1.0000 | ORAL_TABLET | Freq: Every day | ORAL | Status: DC
  Administered 2020-12-16 – 2020-12-17 (×2): 1 via ORAL
  Filled 2020-12-15 (×2): qty 1

## 2020-12-15 MED ORDER — DEXAMETHASONE SODIUM PHOSPHATE 10 MG/ML IJ SOLN
INTRAMUSCULAR | Status: DC | PRN
  Administered 2020-12-15: 10 mg via INTRAVENOUS

## 2020-12-15 MED ORDER — FENTANYL CITRATE (PF) 100 MCG/2ML IJ SOLN
INTRAMUSCULAR | Status: DC | PRN
  Administered 2020-12-15: 100 ug via INTRAVENOUS

## 2020-12-15 MED ORDER — KETOROLAC TROMETHAMINE 30 MG/ML IJ SOLN
INTRAMUSCULAR | Status: AC
  Filled 2020-12-15: qty 1

## 2020-12-15 MED ORDER — SODIUM CHLORIDE 0.9 % IV SOLN
INTRAVENOUS | Status: AC
  Filled 2020-12-15: qty 500

## 2020-12-15 MED ORDER — LACTATED RINGERS AMNIOINFUSION
INTRAVENOUS | Status: DC
  Administered 2020-12-15: 300 mL via INTRAUTERINE

## 2020-12-15 MED ORDER — COCONUT OIL OIL: 1.0000 "application " | TOPICAL_OIL | Status: DC | PRN

## 2020-12-15 MED ORDER — WITCH HAZEL-GLYCERIN EX PADS: 1.0000 "application " | MEDICATED_PAD | CUTANEOUS | Status: DC | PRN

## 2020-12-15 MED ORDER — ONDANSETRON HCL 4 MG/2ML IJ SOLN
INTRAMUSCULAR | Status: DC | PRN
  Administered 2020-12-15: 4 mg via INTRAVENOUS

## 2020-12-15 MED ORDER — PHENYLEPHRINE 40 MCG/ML (10ML) SYRINGE FOR IV PUSH (FOR BLOOD PRESSURE SUPPORT)
PREFILLED_SYRINGE | INTRAVENOUS | Status: DC | PRN
  Administered 2020-12-15 (×2): 100 ug via INTRAVENOUS

## 2020-12-15 MED ORDER — DEXAMETHASONE SODIUM PHOSPHATE 10 MG/ML IJ SOLN
INTRAMUSCULAR | Status: AC
  Filled 2020-12-15: qty 1

## 2020-12-15 MED ORDER — ONDANSETRON HCL 4 MG/2ML IJ SOLN
INTRAMUSCULAR | Status: AC
  Filled 2020-12-15: qty 2

## 2020-12-15 MED ORDER — SENNOSIDES-DOCUSATE SODIUM 8.6-50 MG PO TABS
2.0000 | ORAL_TABLET | Freq: Every day | ORAL | Status: DC
  Administered 2020-12-16 – 2020-12-17 (×2): 2 via ORAL
  Filled 2020-12-15 (×2): qty 2

## 2020-12-15 MED ORDER — ENOXAPARIN SODIUM 60 MG/0.6ML ~~LOC~~ SOLN
50.0000 mg | SUBCUTANEOUS | Status: DC
  Administered 2020-12-16 – 2020-12-17 (×2): 50 mg via SUBCUTANEOUS
  Filled 2020-12-15 (×2): qty 0.6

## 2020-12-15 MED ORDER — INSULIN ASPART 100 UNIT/ML ~~LOC~~ SOLN
0.0000 [IU] | SUBCUTANEOUS | Status: DC
  Administered 2020-12-15: 2 [IU] via SUBCUTANEOUS

## 2020-12-15 MED ORDER — DIBUCAINE (PERIANAL) 1 % EX OINT
1.0000 "application " | TOPICAL_OINTMENT | CUTANEOUS | Status: DC | PRN
  Administered 2020-12-16: 1 via RECTAL
  Filled 2020-12-15: qty 28

## 2020-12-15 MED ORDER — LABETALOL HCL 200 MG PO TABS
200.0000 mg | ORAL_TABLET | Freq: Two times a day (BID) | ORAL | Status: DC
  Administered 2020-12-15 – 2020-12-17 (×4): 200 mg via ORAL
  Filled 2020-12-15 (×4): qty 1

## 2020-12-15 MED ORDER — LIDOCAINE-EPINEPHRINE (PF) 2 %-1:200000 IJ SOLN
INTRAMUSCULAR | Status: DC | PRN
  Administered 2020-12-15: 5 mL via EPIDURAL
  Administered 2020-12-15: 10 mL via EPIDURAL

## 2020-12-15 MED ORDER — ACETAMINOPHEN 325 MG PO TABS
650.0000 mg | ORAL_TABLET | ORAL | Status: DC | PRN
  Administered 2020-12-17: 650 mg via ORAL
  Filled 2020-12-15: qty 2

## 2020-12-15 MED ORDER — OXYCODONE HCL 5 MG PO TABS
5.0000 mg | ORAL_TABLET | ORAL | Status: DC | PRN
  Administered 2020-12-17 (×2): 5 mg via ORAL
  Filled 2020-12-15 (×2): qty 1

## 2020-12-15 MED ORDER — OXYTOCIN-SODIUM CHLORIDE 30-0.9 UT/500ML-% IV SOLN: 2.5000 [IU]/h | INTRAVENOUS | Status: DC

## 2020-12-15 MED ORDER — MORPHINE SULFATE (PF) 0.5 MG/ML IJ SOLN
INTRAMUSCULAR | Status: AC
  Filled 2020-12-15: qty 10

## 2020-12-15 MED ORDER — SOD CITRATE-CITRIC ACID 500-334 MG/5ML PO SOLN: 30.0000 mL | ORAL | Status: DC

## 2020-12-15 MED ORDER — MORPHINE SULFATE (PF) 2 MG/ML IV SOLN: 1.0000 mg | INTRAVENOUS | Status: DC | PRN

## 2020-12-15 MED ORDER — KETOROLAC TROMETHAMINE 30 MG/ML IJ SOLN
30.0000 mg | Freq: Once | INTRAMUSCULAR | Status: AC
  Administered 2020-12-15: 30 mg via INTRAVENOUS

## 2020-12-15 MED ORDER — FENTANYL CITRATE (PF) 100 MCG/2ML IJ SOLN
INTRAMUSCULAR | Status: AC
  Filled 2020-12-15: qty 2

## 2020-12-15 MED ORDER — ZOLPIDEM TARTRATE 5 MG PO TABS: 5.0000 mg | ORAL_TABLET | Freq: Every evening | ORAL | Status: DC | PRN

## 2020-12-15 MED ORDER — PHENYLEPHRINE 40 MCG/ML (10ML) SYRINGE FOR IV PUSH (FOR BLOOD PRESSURE SUPPORT)
PREFILLED_SYRINGE | INTRAVENOUS | Status: AC
  Filled 2020-12-15: qty 10

## 2020-12-15 MED ORDER — SIMETHICONE 80 MG PO CHEW
80.0000 mg | CHEWABLE_TABLET | Freq: Three times a day (TID) | ORAL | Status: DC
  Administered 2020-12-15 – 2020-12-17 (×5): 80 mg via ORAL
  Filled 2020-12-15 (×6): qty 1

## 2020-12-15 MED ORDER — SCOPOLAMINE 1 MG/3DAYS TD PT72
MEDICATED_PATCH | TRANSDERMAL | Status: AC
  Filled 2020-12-15: qty 1

## 2020-12-15 MED ORDER — LACTATED RINGERS IV SOLN: INTRAVENOUS | Status: DC | PRN

## 2020-12-15 MED ORDER — SODIUM CHLORIDE 0.9 % IV SOLN
500.0000 mg | INTRAVENOUS | Status: AC
  Administered 2020-12-15: 500 mg via INTRAVENOUS

## 2020-12-15 MED ORDER — LEVOTHYROXINE SODIUM 50 MCG PO TABS
25.0000 ug | ORAL_TABLET | Freq: Every day | ORAL | Status: DC
  Administered 2020-12-16 – 2020-12-17 (×2): 25 ug via ORAL
  Filled 2020-12-15 (×2): qty 1

## 2020-12-15 MED ORDER — METFORMIN HCL 500 MG PO TABS
500.0000 mg | ORAL_TABLET | Freq: Two times a day (BID) | ORAL | Status: DC
  Administered 2020-12-16 – 2020-12-17 (×3): 500 mg via ORAL
  Filled 2020-12-15 (×3): qty 1

## 2020-12-15 SURGICAL SUPPLY — 41 items
BENZOIN TINCTURE PRP APPL 2/3 (GAUZE/BANDAGES/DRESSINGS) ×2 IMPLANT
CANISTER PREVENA PLUS 150 (CANNISTER) ×2 IMPLANT
CHLORAPREP W/TINT 26ML (MISCELLANEOUS) ×2 IMPLANT
CLAMP CORD UMBIL (MISCELLANEOUS) IMPLANT
CLOSURE STERI STRIP 1/2 X4 (GAUZE/BANDAGES/DRESSINGS) ×2 IMPLANT
CLOTH BEACON ORANGE TIMEOUT ST (SAFETY) ×2 IMPLANT
DRAPE C SECTION CLR SCREEN (DRAPES) ×2 IMPLANT
DRESSING PREVENA PLUS CUSTOM (GAUZE/BANDAGES/DRESSINGS) ×1 IMPLANT
DRSG OPSITE POSTOP 4X10 (GAUZE/BANDAGES/DRESSINGS) ×2 IMPLANT
DRSG PREVENA PLUS CUSTOM (GAUZE/BANDAGES/DRESSINGS) ×2
ELECT REM PT RETURN 9FT ADLT (ELECTROSURGICAL) ×2
ELECTRODE REM PT RTRN 9FT ADLT (ELECTROSURGICAL) ×1 IMPLANT
EXTRACTOR VACUUM KIWI (MISCELLANEOUS) IMPLANT
GLOVE BIOGEL M 7.0 STRL (GLOVE) ×2 IMPLANT
GLOVE BIOGEL PI IND STRL 6.5 (GLOVE) ×1 IMPLANT
GLOVE BIOGEL PI IND STRL 7.0 (GLOVE) ×2 IMPLANT
GLOVE BIOGEL PI INDICATOR 6.5 (GLOVE) ×1
GLOVE BIOGEL PI INDICATOR 7.0 (GLOVE) ×2
GOWN STRL REUS W/TWL LRG LVL3 (GOWN DISPOSABLE) ×6 IMPLANT
HEMOSTAT ARISTA ABSORB 3G PWDR (HEMOSTASIS) ×2 IMPLANT
KIT ABG SYR 3ML LUER SLIP (SYRINGE) IMPLANT
NEEDLE HYPO 25X5/8 SAFETYGLIDE (NEEDLE) IMPLANT
NS IRRIG 1000ML POUR BTL (IV SOLUTION) ×2 IMPLANT
PACK C SECTION WH (CUSTOM PROCEDURE TRAY) ×2 IMPLANT
PAD OB MATERNITY 4.3X12.25 (PERSONAL CARE ITEMS) ×2 IMPLANT
PENCIL SMOKE EVAC W/HOLSTER (ELECTROSURGICAL) ×2 IMPLANT
RETRACTOR TRAXI PANNICULUS (MISCELLANEOUS) ×1 IMPLANT
RTRCTR C-SECT PINK 25CM LRG (MISCELLANEOUS) IMPLANT
SPONGE LAP 18X18 RF (DISPOSABLE) ×4 IMPLANT
STRIP CLOSURE SKIN 1/2X4 (GAUZE/BANDAGES/DRESSINGS) ×2 IMPLANT
SUT MNCRL 0 VIOLET CTX 36 (SUTURE) ×4 IMPLANT
SUT MONOCRYL 0 CTX 36 (SUTURE) ×4
SUT PDS AB 0 CTX 36 PDP370T (SUTURE) ×2 IMPLANT
SUT PLAIN 0 NONE (SUTURE) IMPLANT
SUT VIC AB 2-0 CT1 27 (SUTURE)
SUT VIC AB 2-0 CT1 TAPERPNT 27 (SUTURE) IMPLANT
SUT VIC AB 4-0 KS 27 (SUTURE) ×2 IMPLANT
TOWEL OR 17X24 6PK STRL BLUE (TOWEL DISPOSABLE) ×2 IMPLANT
TRAXI PANNICULUS RETRACTOR (MISCELLANEOUS) ×1
TRAY FOLEY W/BAG SLVR 14FR LF (SET/KITS/TRAYS/PACK) ×2 IMPLANT
WATER STERILE IRR 1000ML POUR (IV SOLUTION) ×2 IMPLANT

## 2020-12-15 NOTE — Progress Notes (Signed)
OB Progress Note  S: Patient resting comfortably with epidural.  Pitocin has been off for about 1 hour.  O: BP 112/70   Pulse 76   Temp 98.2 F (36.8 C) (Oral)   Resp 18   Ht 5\' 2"  (1.575 m)   Wt 103.6 kg   LMP 03/18/2020   BMI 41.77 kg/m   FHT: 125bpm, moderate variablity, + accels, - decels Toco: q2-3 minutes SVE: 7-8/80/-1, difficult to assess position of fetal head - caput present and feels as though may be asynclinitic  A/P: 34 y.o. G1P0  @ [redacted]w[redacted]d admitted for induction of labor for chronic HTN and pre-gestational DM.  FWB: Cat. I, previously Category II with recurrent lates while pitocin was on Labor course: Currently on pitocin break, will restart, ruptured since ~2130 last night Pain: Epidural GBS: Positive, receiving PCN  We discussed restarting pitocin to re-attempt getting into normal labor pattern, however, there has been difficulty with fetus tolerating this.  Discussed possibility of needing to proceed with cesarean section if unable to use pitocin to further augment her labor.  Has been in each position - right, left, and throne with peanut ball in between her legs.  Will try placing on side with peanut ball in between ankles to allow pelvis to open.  07-29-1989, DO [phone] (office)

## 2020-12-15 NOTE — Lactation Note (Addendum)
This note was copied from a baby's chart. Lactation Consultation Note  Patient Name: Kathryn Buck DVVOH'Y Date: 12/15/2020 Reason for consult: Initial assessment;1st time breastfeeding;Early term 37-38.6wks;Maternal endocrine disorder;Other (Comment) (C/S delivery) Age:35 hours P1, 38 w 6 day ETI female infant. Mom with hx: hypothyroidism, CHTN-meds-Labetalol -L2 safe with breastfeeding, DM2-metformin -L2-safe with breastfeeding, HSV- valtrex- L2 safe with breastfeeding. When LC assessed mom's breast, LC notice mom has polythelia accessory nipples below the 6 o'clock  position both breast and accessory nipple adjacent to mom's right nipple.  Mom is aware of the accessory nipples but did not know the medical term for it.  Mom's  current feeding choice is breast and donor breast milk: LC entered the room, infant was in [address] due hypothermia under warmer. Per mom, infant did not latch in recovery only took few drops of colostrum on gloved finger from RN. Per mom, infant briefly latched less than 5 minutes in MBU 2nd attempt, then was later taken to [facility] for low body temps ( hypothermia). LC reviewed hand expression with mom, mom expressed 3 mls of colostrum that dad took to [employer]. Mom will continue to work towards latching infant at the breast and will ask for latch assistance from RN or LC. Mom understands to BF infant according to hunger cues, 8 to 12+ times within 24 hours, STS. Mom's current BF plan: 1. Mom will pre-pump breast with hand pump due to being short shafted prior to latching infant at the breast, mom will BF infant according to hunger cues, 8 to 12+ times within 24 hrs, STS. 2. Mom will ask for latch assistance from RN or LC if needed, [facility] written name on white board. 3. Mom will supplement infant with donor breast milk after latching infant at breast for each feeding, Day 1 ( 5 to 7 mls ) per feeding, use curve tip syringe RN will teach parents how to  use curve tip syringe with infant. 4. Mom will use DEBP every 3 hours for 15 minutes on initial setting.  Maternal Data Formula Feeding for Exclusion: No Has patient been taught Hand Expression?: Yes Does the patient have breastfeeding experience prior to this delivery?: No  Feeding Feeding Type: Breast Milk  LATCH Score                   Interventions Interventions: Breast feeding basics reviewed;Skin to skin;Breast massage;Hand express;DEBP;Breast compression;Pre-pump if needed;Expressed milk  Lactation Tools Discussed/Used Tools: Pump Breast pump type: Double-Electric Breast Pump;Manual WIC Program: No Pump Education: Setup, frequency, and cleaning;Milk Storage Initiated by:: RN mom fitted with 24 mm breast flange Date initiated:: 12/15/20   Consult Status Consult Status: Follow-up Date: 12/16/20 Follow-up type: In-patient    Danelle Earthly 12/15/2020, 6:22 PM

## 2020-12-15 NOTE — Progress Notes (Addendum)
OB Progress Note  In to assess for cervical change.  Pitocin was restarted and now currently at 33mU/min.  She is feeling low back pain.  SVE 7-8/80/-1 (unchanged), position of fetal head feels asynclitic.  FHT 130bpm with moderate variability and recurrent subtle late decelerations after each contraction.  Unable to titrate up on pitocin despite position changes and resuscitation.  I discussed indication to proceed with cesarean delivery.  Patient verbalizes understanding and agrees with plan of care. Epidural currently in place and working well.  LD Staff, OR charge, and anesthesia aware.  I have explained to the patient that this surgery is performed to deliver their baby or babies through an incision in the abdomen and incision in the uterus.  Prior to surgery, the risks and benefits of the surgery, as well as alternative treatments were discussed.  The risks include, but are not limited to, possible need for cesarean delivery for all future pregnancies, bleeding at the time of surgery that could necessitate a blood transfusion and/or hysterectomy, rupture of the uterus during a future pregnancy that could cause a preterm delivery and/or requiring hysterectomy, infection, damage to surrounding organs and tissues, damage to bladder, damage to ureters, causing kidney damage, and requiring additional procedures, damage to bowels, resulting in further surgery, postoperative pain, short-term and long-term, scarring on the abdominal wall and intra-abdominally, need for further surgery, development of an incisional hernia, deep vein thrombosis and/or pulmonary embolism, wound infection and/or separation, painful intercourse, urinary leakage, impact on future pregnancies including but not limited to, abnormal location or attachment of the placenta to the uterus, such as placenta previa or accreta, that may necessitate a blood transfusion and/or hysterectomy, impact on total family size, complications the course of  which cannot be predicted or prevented, and death. Patient was consented for blood products.  The patient is aware that bleeding may result in the need for a blood transfusion which includes risk of transmission of HIV (1:2 million), Hepatitis C (1:2 million), and Hepatitis B (1:200 thousand) and transfusion reaction.  Patient voiced understanding of the above risks as well as understanding of indications for blood transfusion.   Steva Ready, DO [phone] (office)    .

## 2020-12-15 NOTE — Anesthesia Postprocedure Evaluation (Signed)
Anesthesia Post Note  Patient: Kathryn Buck  Procedure(s) Performed: CESAREAN SECTION (N/A )     Patient location during evaluation: PACU Anesthesia Type: Epidural Level of consciousness: oriented and awake and alert Pain management: pain level controlled Vital Signs Assessment: post-procedure vital signs reviewed and stable Respiratory status: spontaneous breathing, respiratory function stable and patient connected to nasal cannula oxygen Cardiovascular status: blood pressure returned to baseline and stable Postop Assessment: no headache, no backache, no apparent nausea or vomiting and epidural receding Anesthetic complications: no   No complications documented.  Last Vitals:  Vitals:   12/15/20 1124 12/15/20 1213  BP: 121/72 117/70  Pulse:    Resp:    Temp: 36.9 C 36.6 C  SpO2: 96% 98%    Last Pain:  Vitals:   12/15/20 1213  TempSrc:   PainSc: 4    Pain Goal: Patients Stated Pain Goal: 1 (12/14/20 1748)                 [name] L Celita Aron

## 2020-12-15 NOTE — Transfer of Care (Signed)
Immediate Anesthesia Transfer of Care Note  Patient: MCKINSEY KEAGLE  Procedure(s) Performed: CESAREAN SECTION (N/A )  Patient Location: PACU  Anesthesia Type:Epidural  Level of Consciousness: awake, alert , oriented and patient cooperative  Airway & Oxygen Therapy: Patient Spontanous Breathing  Post-op Assessment: Report given to RN, Post -op Vital signs reviewed and stable and Patient moving all extremities X 4  Post vital signs: Reviewed and stable  Last Vitals:  Vitals Value Taken Time  BP 126/81 12/15/20 1003  Temp    Pulse 80 12/15/20 1009  Resp 23 12/15/20 1009  SpO2 100 % 12/15/20 1009  Vitals shown include unvalidated device data.  Last Pain:  Vitals:   12/15/20 0630  TempSrc: Oral  PainSc: 0-No pain      Patients Stated Pain Goal: 1 (12/14/20 1748)  Complications: No complications documented.

## 2020-12-15 NOTE — Op Note (Addendum)
Pre Op Dx:   1.  Single live IUP at [redacted]w[redacted]d 2.  Non-reassuring fetal heart tracing  3.  Failure to progress Post Op Dx:  Same as pre-op diagnoses Procedure:  Low Transverse Cesarean Section, Left uterine artery ligation  Surgeon:  Dr. Steva Ready Assistants:  Verdis Prime, CNM Anesthesia:  Epidural  EBL:  1123cc  IVF:  1500cc UOP:  700cc  Drains:  None Specimen removed:  Placenta - sent to pathology, umbilical cord gases Device(s) implanted:  None Case Type:  Clean-contaminated Findings: Normal-appearing bilateral fallopian tubes and ovaries. Fetus in cephalic position with clear amniotic fluid.  Nuchal cord noted. Complications: None Indications:  34 y.o. G1P0 at [redacted]w[redacted]d undergoing induction of labor for chronic hypertension and DM type II who had persistent Category II fetal heart tracing (recurrent late decelerations) with pitocin.  Cervix remained 7-8cm.   Procedure:  After informed consent was obtained, the patient was brought to the operating room.  Patient had epidural in place.  The patient was positioned in dorsal supine position with a leftward tilt and was prepped and draped in sterile fashion.  A preoperative time-out was performed.  The abdomen was entered in layers through a pfannenstiel incision and a retractor was placed.  When entering the peritoneum, the dome of the bladder was noted to protrude through the peritoneal opening.  A low transverse hysterotomy was created sharply to the level of the membranes, then extended bluntly.  The fetus was delivered from cephalic presentation onto the field.  Bulb suctioning was performed.  The cord was doubly clamped and cut after a 60 second pause.  The newborn was passed to the warmer.  The placenta was delivered.  The uterus was swept free of clots and debris and closed in a running locked fashion with 0-Monocryl.  A second imbricating layer was used to close the uterus using 0-Monocryl.  The left uterine artery was noted to  bleeding and was ligated using a figure-of-eight suture with 0-Monocryl.  Hemostasis was verified. Arista powder was placed along the hysterotomy.  The abdomen was irrigated with warmed saline and cleared of clots. Subfascial spaces were inspected and hemostasis assured.  Remainder of Arista powder was placed atop the rectus muscle bellies. The bladder was backfilled with 150cc of sterile milk and there was no defect noted or extravasation of sterile milk.  The fascia was closed in a running fashion with 0-PDS.  The subcutaneous tissues were irrigated and hemostasis assured.  The subcutaneous tissues were closed with 3-0 Monocryl in a running fashion.  The skin was closed with 4-0 Vicryl.  Prevena wound system applied.  The patient was transferred to PACU.  All needle, sponge, and instrument counts were correct at the end of the case.   Disposition:  PACU  I was present and scrubbed and the assistant was required due to the complexity of the anatomy.  Steva Ready, DO

## 2020-12-15 NOTE — Progress Notes (Signed)
Called Dr. Sallye Ober regarding pt's order for q4hr sliding scale insulin. CBGs ordered AC and HS. Pt reports only took metformin during pregnancy. Metformin ordered to restart in the morning. Per Dr. Sallye Ober discontinue sliding scale insulin. Parameters given to call MD.

## 2020-12-15 NOTE — Progress Notes (Signed)
OB Progress Note  In to assess for cervical change.  Cervix 7-8/80/-1.  With contractions only anterior lip palpable.  Anterior lip is thick and non-reducible with practice pushing.  FHT 130sbpm, moderate variability, - accel, - decel.  Contractions every 1-2 minutes.  Will allow for further cervical change.    Steva Ready, DO

## 2020-12-15 NOTE — Progress Notes (Signed)
OB Progress Note  FHT reviewed at 1243 - recurrent lates noted on FHT.  Currently receiving amnioinfusion.  Variability moderate.    FHT reviewed again at 0120 - After amnioinfusion, improvement in FHT noted.  Pitocin decreased to 38mU/min.  FHT without late decelerations, baseline 130sbpm, moderate variability, + accels and - decelerations.  Contractions every 1-3 minutes.    Will continue to monitor closely and assess for cervical change actively.  Steva Ready, DO

## 2020-12-15 NOTE — Progress Notes (Signed)
OB Progress Note  FHT reviewed - baseline 130s bpm, moderate variability, variable decelerations present after contraction.  Contractions every 1.5-2.5 minutes.  Call was made to primary RN to start amnioinfusion - 300cc bolus followed by 125cc/hour.  Cervical exam 7/90/-1 per primary RN.  Pitocin at 35mU/min. Anticipate SVD.  Will continue to monitor.  FHT Category II - reassured by moderate variability.  Steva Ready, DO

## 2020-12-16 LAB — CBC
HCT: 27.8 % — ABNORMAL LOW (ref 36.0–46.0)
Hemoglobin: 8.9 g/dL — ABNORMAL LOW (ref 12.0–15.0)
MCH: 27.2 pg (ref 26.0–34.0)
MCHC: 32 g/dL (ref 30.0–36.0)
MCV: 85 fL (ref 80.0–100.0)
Platelets: 311 10*3/uL (ref 150–400)
RBC: 3.27 MIL/uL — ABNORMAL LOW (ref 3.87–5.11)
RDW: 14.7 % (ref 11.5–15.5)
WBC: 21.4 10*3/uL — ABNORMAL HIGH (ref 4.0–10.5)
nRBC: 0 % (ref 0.0–0.2)

## 2020-12-16 LAB — GLUCOSE, CAPILLARY
Glucose-Capillary: 111 mg/dL — ABNORMAL HIGH (ref 70–99)
Glucose-Capillary: 135 mg/dL — ABNORMAL HIGH (ref 70–99)

## 2020-12-16 NOTE — Lactation Note (Signed)
This note was copied from a baby's chart. Lactation Consultation Note  Patient Name: Kathryn Buck HGDJM'E Date: 12/16/2020   Age:35 hours  Mom getting ready to take a shower.  Would like help with breastfeeding and hand expressing when she gets out she said.  Urged her to let her RN know to call lactation  Maternal Data    Feeding Feeding Type: Breast Fed  [facility] Score                   Interventions    Lactation Tools Discussed/Used     Consult Status      Neomia Dear 12/16/2020, 3:27 PM

## 2020-12-16 NOTE — Progress Notes (Signed)
Subjective: POD# 1 Information for the patient's newborn:  Shaune, Malacara [[id number]]  female    Baby Boy Circumcision complete  Reports feeling "really good" Feeding: breast Reports tolerating PO and denies N/V, foley removed, ambulating and urinating w/o difficulty  Pain controlled with acetaminophen, ibuprofen (OTC) and narcotic analgesics including Oxycodone Denies HA/SOB/dizziness  Flatus present Vaginal bleeding is normal, no clots     Objective:  VS:  Vitals:   12/15/20 1757 12/15/20 2237 12/16/20 0156 12/16/20 0557  BP: 126/77 112/67 109/61 108/74  Pulse: 78 (!) 104 90 (!) 103  Resp: 20 18 18 18   Temp: 97.8 F (36.6 C) 97.9 F (36.6 C) 98 F (36.7 C) 98.4 F (36.9 C)  TempSrc:  Oral Oral   SpO2: 99% 97% 97% 98%  Weight:      Height:        Intake/Output Summary (Last 24 hours) at 12/16/2020 1050 Last data filed at 12/16/2020 1000 Gross per 24 hour  Intake 2117.5 ml  Output 2375 ml  Net -257.5 ml     Recent Labs    12/15/20 1043 12/16/20 0337  WBC 20.4* 21.4*  HGB 11.5* 8.9*  HCT 33.9* 27.8*  PLT 372 311    Blood type: --/--/B POS (12/29 0116) Rubella: Immune (06/04 0000)    Physical Exam:  General: alert, cooperative and no distress CV: Regular rate and rhythm, S1S2 present or without murmur or extra heart sounds Resp: clear Abdomen: soft, nontender, normal bowel sounds Incision: clean, dry and intact  Uterine Fundus: firm, below umbilicus, nontender Lochia: minimal Ext: extremities normal, atraumatic, no cyanosis or edema and Homans sign is negative, no sign of DVT   Assessment/Plan: 34 y.o.   POD# 1. G1P1001                  Active Problems:   Type 2 diabetes mellitus affecting pregnancy in third trimester, antepartum   Routine post-op PP care          Advance diet as tolerated Metformin 500mg  BID, CBGs  AC and HS CHTN Labetalol 200mg  BID Hypothyroidism - levothyroxine 31 Hx of DVT - Lovenox  Advised warm fluids and ambulation  to improve GI motility Encourage rest when baby rests Breastfeeding support Anticipate D/C 12/17/20  , MSN, CNM 12/16/2020, 10:50 AM

## 2020-12-16 NOTE — Progress Notes (Signed)
Unable to obtain 1700 CBG due to the patient eating her meal before calling the nurse. Will obtain an HS CBG.

## 2020-12-16 NOTE — Progress Notes (Signed)
Rn entered room, patient eating lunch. We were unable to collect [facility] lunch glucose.

## 2020-12-16 NOTE — Lactation Note (Signed)
This note was copied from a baby's chart. Lactation Consultation Note  Patient Name: Kathryn Buck NBVAP'O Date: 12/16/2020   Age:35 hours  Baby Kathryn Micah cuing on arrival.  Assist with breastfeeding. Assist in football hold on moms right  breast .  At this time mom has swelling and edematous areolas that are not very compressible close to the nipple and make it harder for infant to latch.  Mom was given shells but did not have a bra with her.  Plans to start the shells this pm.  Has a bra now to wear them with.  Some areolar massage helps make the tissue more compressible but still difficult for him to get a deep latch.  Urged mom to also prepump prior to latching to help elongate the nipple more and get drops of milk started for him. He comes off and on.  Did maintain for close to 10 minutes. Some rhythmic sucking with few intermittent swallows.  LC assist mom with feeding on the right breast in more of a modified cross cradle hold.  Used rolled up baby blanket under moms breast to make it easier to hold.  LC used the donor milk in curved tip syringe and kept him going at breast.  He took 5 ml at breast this way and then let go.  Switched to dad giving him the rest via curved tip syringe.  Discussed SNS and mom reports she has seen it and it looks to difficult.  Discussed using just a five french and syringe at the breast.  Parents will think on it.  Mom reports that more donor milk was ordered for him.  However LC did not see it, so took 25 ml of donor milk and gave to parents.  Baby already has labels printed.   Dad asked about getting donor milk for home use.  Let him know I did not know the guidelines for getting it.  Referred him to his insurance company and gave info about the milk bank in [place].  Urged parents to call lactation as needed.  Maternal Data    Feeding    LATCH Score                   Interventions    Lactation Tools Discussed/Used     Consult Status       Randi Poullard Michaelle Copas 12/16/2020, 5:55 PM

## 2020-12-16 NOTE — Progress Notes (Signed)
Asked patient to call RN when her food arrives for each meal, to check glucose. Patient agreed.

## 2020-12-17 DIAGNOSIS — D62 Acute posthemorrhagic anemia: Secondary | ICD-10-CM | POA: Diagnosis not present

## 2020-12-17 LAB — GLUCOSE, CAPILLARY: Glucose-Capillary: 87 mg/dL (ref 70–99)

## 2020-12-17 MED ORDER — OXYCODONE HCL 5 MG PO TABS: 5.0000 mg | ORAL_TABLET | ORAL | 0 refills | Status: AC | PRN

## 2020-12-17 MED ORDER — SENNOSIDES-DOCUSATE SODIUM 8.6-50 MG PO TABS: 2.0000 | ORAL_TABLET | Freq: Every day | ORAL | 0 refills | Status: AC

## 2020-12-17 MED ORDER — ACETAMINOPHEN 325 MG PO TABS: 650.0000 mg | ORAL_TABLET | ORAL | 0 refills | Status: AC | PRN

## 2020-12-17 MED ORDER — POLYSACCHARIDE IRON COMPLEX 150 MG PO CAPS
150.0000 mg | ORAL_CAPSULE | Freq: Every day | ORAL | Status: DC
  Administered 2020-12-17: 150 mg via ORAL
  Filled 2020-12-17: qty 1

## 2020-12-17 MED ORDER — POLYSACCHARIDE IRON COMPLEX 150 MG PO CAPS: 150.0000 mg | ORAL_CAPSULE | Freq: Every day | ORAL | 1 refills | Status: AC

## 2020-12-17 NOTE — Discharge Summary (Signed)
[facility] Discharge Summary     Patient Name: Kathryn Buck DOB: 1986/05/06 MRN: [id number]  Date of admission: 12/14/2020 Delivering MD: Steva Ready  Date of delivery: 12/15/2020 Type of delivery: Primary CS  Newborn Data: Sex: Baby female Circumcision: done in pt Live born female  Birth Weight: 6 lb 5.6 oz (2880 g) APGAR: 9, 9  Newborn Delivery   Birth date/time: 12/15/2020 08:43:00 Delivery type: C-Section, Low Transverse Trial of labor: Yes C-section categorization: Primary      Feeding: breast Infant being discharge to home with mother in stable condition.   Admitting diagnosis: Type 2 diabetes mellitus affecting pregnancy in third trimester, antepartum [O24.113] Intrauterine pregnancy: [redacted]w[redacted]d     Secondary diagnosis:  Active Problems:   Type 2 diabetes mellitus affecting pregnancy in third trimester, antepartum   Cesarean delivery delivered   Normal postpartum course   Acute blood loss anemia                                Complications: None                                                              Intrapartum Procedures: cesarean: low cervical, transverse and GBS prophylaxis Postpartum Procedures: none Complications-Operative and Postpartum: none Augmentation: AROM, Pitocin and Cytotec   History of Present Illness: Ms. Kathryn Buck is a 35 y.o. female, G1P1001, who presents at [redacted]w[redacted]d weeks gestation. The patient has been followed at  [facility]  Her pregnancy has been complicated by:  Patient Active Problem List   Diagnosis Date Noted  . Cesarean delivery delivered 12/17/2020  . Normal postpartum course 12/17/2020  . Acute blood loss anemia 12/17/2020  . Type 2 diabetes mellitus affecting pregnancy in third trimester, antepartum 12/14/2020  . Abnormal glucose tolerance test (GTT) during pregnancy, antepartum 06/10/2020  . Hypertension 11/06/2017  . Hypothyroidism 11/06/2017  . Obesity 09/15/2012  .  DVT (deep venous thrombosis) (HCC) 09/06/2011  . Cellulitis and abscess 09/06/2011    Hospital course:  Induction of Labor With Cesarean Section   35 y.o. yo G1P1001 at [redacted]w[redacted]d was admitted to the hospital 12/14/2020 for induction of labor. Patient had a labor course significant for was IOL for Kathryn Buck is a 35 y.o. femaleG1P0 at 38 wks and 5 days  presenting for induction of labor due to chronic hyperension / type 2 dm . 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 Prenatal care provided by Dr. Gerald Leitz and Dr. Steva Ready with [facility]. The patient went for cesarean section due to Non-Reassuring FHR. Delivery details are as follows: Membrane Rupture Time/Date: 9:40 PM ,12/14/2020   Delivery Method:C-Section, Low Transverse  Details of operation can be found in separate operative Note.  Patient had an uncomplicated postpartum course. She is ambulating, tolerating a regular diet, passing flatus, and urinating well.  Patient is discharged home in stable condition on 12/17/20.      Newborn Data: Birth date:12/15/2020  Birth time:8:43 AM  Gender:Female  Living status:Living  Apgars:9 ,9  Weight:2880 g  Postpartum POD Day # 2 : Pt meets criteria for early discharge and desires to go home.   Hospital Course-- Cesarean: Patient was admitted on 12/15/2020 for a IOL then proceeded with PCS for NRFHT Primary cesarean delivery.   She was taken to the operating room, where Dr. Delora Fuel performed a primary LTCS under spinal anesthesia, with delivery of a viable baby female, with weight and Apgars as listed below. Infant was in good condition and remained at the patient's bedside.  The patient was taken to recovery in good condition.  Patient planned to breast feed.  On post-op day 1, patient was doing well, tolerating a regular diet, with Hgb of 11.6-8.9, started po iron, asymptomatic.  Throughout her stay, her physical exam was  WNL, her incision was CDI, and her vital signs remained stable.  By post-op day 1, she was up ad lib, tolerating a regular diet, with good pain control with po med.  She was deemed to have received the full benefit of her hospital stay, and was discharged home in stable condition.  Contraceptive choice was po meds. Pt continues labetalol 200mg  BID PO for CHTN, labs unremarkable, PCR upon admission was not taken, pt asymptomatic, denies HA, RUQ pain ro vision changes, BP 112/81. DM2 pt continues on metformin 500mg  BID, CBG WNL, pt encouraged to diet and exersices once not PP. Will get HGA1C checked at 6 weeks PPV. Pt denies s/sx of hypothyroidism, continues on synthroid 25 mcg daily will get TSH drawn at 6 weeks PPV. Pt had h/o DVT not on lovenox or heparin during pregnancy, but has been since PP and will continue to take daily SQ lovenox at home pt endorses already having meds at home.   CBG (last 3)  Recent Labs    12/16/20 0851 12/16/20 2138 12/17/20 1108  GLUCAP 111* 135* 87   Physical exam  Vitals:   12/16/20 0156 12/16/20 0557 12/16/20 2126 12/17/20 0622  BP: 109/61 108/74 122/75 112/81  Pulse: 90 (!) 103 97 89  Resp: 18 18 20 18   Temp: 98 F (36.7 C) 98.4 F (36.9 C) 97.6 F (36.4 C) 98 F (36.7 C)  TempSrc: Oral  Oral Oral  SpO2: 97% 98% 100% 97%  Weight:      Height:       General: alert, cooperative and no distress Lochia: appropriate Uterine Fundus: firm Incision: Healing well with no significant drainage, No significant erythema, Dressing is clean, dry, and intact, honeycomb dressing CDI Perineum: Intact DVT Evaluation: No evidence of DVT seen on physical exam. Negative Homan's sign. No cords or calf tenderness. No significant calf/ankle edema.  Labs: Lab Results  Component Value Date   WBC 21.4 (H) 12/16/2020   HGB 8.9 (L) 12/16/2020   HCT 27.8 (L) 12/16/2020   MCV 85.0 12/16/2020   PLT 311 12/16/2020   CMP Latest Ref Rng & Units 12/14/2020  Glucose 70 - 99  mg/dL 137(H)  BUN 6 - 20 mg/dL 11  Creatinine 0.44 - 1.00 mg/dL 0.71  Sodium 135 - 145 mmol/L 137  Potassium 3.5 - 5.1 mmol/L 3.7  Chloride 98 - 111 mmol/L 105  CO2 22 - 32 mmol/L 22  Calcium 8.9 - 10.3 mg/dL 9.2  Total Protein 6.5 - 8.1 g/dL 6.2(L)  Total Bilirubin 0.3 - 1.2 mg/dL 0.3  Alkaline Phos 38 - 126 U/L 141(H)  AST 15 - 41 U/L 23  ALT 0 - 44 U/L 31    Date of discharge: 12/17/2020 Discharge Diagnoses: Term Pregnancy-delivered and  CHTN, DM2, Hypothyroidism, H/O DVT Discharge instruction: per After Visit Summary and "Baby and Me Booklet".  After visit meds:   Activity:           unrestricted and pelvic rest Advance as tolerated. Pelvic rest for 6 weeks.  Diet:                routine Medications: PNV, Colace, Iron and oxy IR Postpartum contraception: Nexplanon Condition:  Pt discharge to home with baby in stable and condition Anemia: PO Iron Hypothyroidism; Continue synthroid 60mcg daily, TSH at 6 weeks PP CHTN: continue labetalol 200mg  BID PO, BP check in one week , monitor if >150/90s or HA, RUQ pain or vision changes report. DM2: Continue metformin 500mg  BID may take 1000mg  PO daily, recheck HgA1C at 6 week, diet and exercise.  H/O DVT: Pt to continue lovenox daily for 6 weeks PP, has medication at home already, check up in one week for monitoring.   Meds: Allergies as of 12/17/2020   No Known Allergies     Medication List    STOP taking these medications   aspirin EC 81 MG tablet     TAKE these medications   acetaminophen 500 MG tablet Commonly known as: TYLENOL Take 500 mg by mouth every 8 (eight) hours as needed for mild pain. What changed: Another medication with the same name was added. Make sure you understand how and when to take each.   acetaminophen 325 MG tablet Commonly known as: TYLENOL Take 2 tablets (650 mg total) by mouth every 4 (four) hours as needed for mild pain (temperature > 101.5.). What changed: You were already taking a medication with  the same name, and this prescription was added. Make sure you understand how and when to take each.   cetirizine 10 MG tablet Commonly known as: ZYRTEC Take 10 mg by mouth daily as needed for allergies.   iron polysaccharides 150 MG capsule Commonly known as: NIFEREX Take 1 capsule (150 mg total) by mouth daily.   labetalol 200 MG tablet Commonly known as: NORMODYNE Take 200 mg by mouth 2 (two) times daily.   levothyroxine 25 MCG tablet Commonly known as: Levothroid Take 1 tablet (25 mcg total) by mouth daily before breakfast.   metFORMIN 500 MG tablet Commonly known as: GLUCOPHAGE Take 500 mg by mouth 2 (two) times daily with a meal.   oxyCODONE 5 MG immediate release tablet Commonly known as: Oxy IR/ROXICODONE Take 1 tablet (5 mg total) by mouth every 4 (four) hours as needed for moderate pain.   prenatal multivitamin Tabs tablet Take 1 tablet by mouth daily at 12 noon.   pyridOXINE 50 MG tablet Commonly known as: VITAMIN B-6 Take 1 tablet (50 mg total) by mouth daily.   senna 8.6 MG Tabs tablet Commonly known as: SENOKOT Take 1 tablet by mouth daily as needed for mild constipation.   senna-docusate 8.6-50 MG tablet Commonly known as: Senokot-S Take 2 tablets by mouth daily.   valACYclovir 500 MG tablet Commonly known as: VALTREX Take 500 mg by mouth 2 (two) times daily.            Discharge Care Instructions  (From admission, onward)         Start     Ordered   12/17/20 0000  Discharge wound care:       Comments: Take dressing off on day 5-7 postpartum.  Report increased drainage, redness or warmth. Clean with water, let soap trickle down body. Can leave steri  strips on until they fall off or take them off gently at day 10. Keep open to air, clean and dry.   12/17/20 0817          Discharge Follow Up:   Follow-up [facility]. Schedule an appointment as soon as possible for a visit in 1 week(s).    Specialty: Obstetrics and Gynecology Why: 1 weeks BP, wound check and 6 weeks PPV Contact information: [address] [place] [phone] [place], NP-C, CNM 12/17/2020, 8:20 AM  Noralyn Pick, [place]

## 2020-12-17 NOTE — Lactation Note (Signed)
This note was copied from a baby's chart. Lactation Consultation Note  Patient Name: Kathryn Buck [id number] Date: 12/17/2020 Reason for consult: Follow-up assessment Age:36 hours  P1 mother whose infant is now 8 hours old.  This is an ETI at 38+6 weeks.  Mother had no further questions/concerns related to breast feeding.  She will continue to feed at least 8-12 times/24 hours or sooner if baby shows feeding cues.  Engorgement/prevention treatment reviewed.    Mother has a manual pump and a DEBP for home use.  She has our OP phone number for any questions after discharge.  Father present.   Maternal Data    Feeding    LATCH Score                   Interventions    Lactation Tools Discussed/Used     Consult Status Consult Status: Complete Date: 12/17/20 Follow-up type: Call as needed    [name] 12/17/2020, 3:44 PM

## 2020-12-19 LAB — SURGICAL PATHOLOGY

## 2020-12-22 ENCOUNTER — Telehealth (HOSPITAL_COMMUNITY): Payer: Self-pay

## 2020-12-22 NOTE — Telephone Encounter (Signed)
Mom is under the weather/sick/taking COVID test.  Asking if its okay to pump with COVID.  Has quit breastfeeding now and has someone watching baby and she is exclusively pumping.  Discussed wearing a mask, clean hands. Keeping pump parts clean, making sure hands are clean when transfers milk to another container. Praised breastfeeding.  Urged to call lactation as needed.

## 2020-12-23 ENCOUNTER — Other Ambulatory Visit: Payer: Managed Care, Other (non HMO)

## 2020-12-25 ENCOUNTER — Other Ambulatory Visit: Payer: Self-pay

## 2020-12-25 ENCOUNTER — Inpatient Hospital Stay (HOSPITAL_BASED_OUTPATIENT_CLINIC_OR_DEPARTMENT_OTHER)
Admission: EM | Admit: 2020-12-25 | Discharge: 2020-12-30 | DRG: 776 | Disposition: A | Discharge status: Home / Self Care | Payer: Managed Care, Other (non HMO) | Attending: Family Medicine | Admitting: Family Medicine

## 2020-12-25 ENCOUNTER — Emergency Department (HOSPITAL_BASED_OUTPATIENT_CLINIC_OR_DEPARTMENT_OTHER): Payer: Managed Care, Other (non HMO)

## 2020-12-25 DIAGNOSIS — E669 Obesity, unspecified: Secondary | ICD-10-CM | POA: Diagnosis present

## 2020-12-25 DIAGNOSIS — O9853 Other viral diseases complicating the puerperium: Principal | ICD-10-CM | POA: Diagnosis present

## 2020-12-25 DIAGNOSIS — J9601 Acute respiratory failure with hypoxia: Secondary | ICD-10-CM | POA: Diagnosis present

## 2020-12-25 DIAGNOSIS — R0902 Hypoxemia: Secondary | ICD-10-CM

## 2020-12-25 DIAGNOSIS — E039 Hypothyroidism, unspecified: Secondary | ICD-10-CM | POA: Diagnosis present

## 2020-12-25 DIAGNOSIS — I5033 Acute on chronic diastolic (congestive) heart failure: Secondary | ICD-10-CM | POA: Diagnosis present

## 2020-12-25 DIAGNOSIS — Z86718 Personal history of other venous thrombosis and embolism: Secondary | ICD-10-CM

## 2020-12-25 DIAGNOSIS — O24419 Gestational diabetes mellitus in pregnancy, unspecified control: Secondary | ICD-10-CM

## 2020-12-25 DIAGNOSIS — Z7984 Long term (current) use of oral hypoglycemic drugs: Secondary | ICD-10-CM

## 2020-12-25 DIAGNOSIS — U071 COVID-19: Secondary | ICD-10-CM | POA: Diagnosis present

## 2020-12-25 DIAGNOSIS — R0602 Shortness of breath: Secondary | ICD-10-CM

## 2020-12-25 DIAGNOSIS — Z79890 Hormone replacement therapy (postmenopausal): Secondary | ICD-10-CM

## 2020-12-25 DIAGNOSIS — Z7901 Long term (current) use of anticoagulants: Secondary | ICD-10-CM

## 2020-12-25 DIAGNOSIS — I10 Essential (primary) hypertension: Secondary | ICD-10-CM | POA: Diagnosis present

## 2020-12-25 DIAGNOSIS — J1282 Pneumonia due to coronavirus disease 2019: Secondary | ICD-10-CM | POA: Diagnosis present

## 2020-12-25 DIAGNOSIS — K219 Gastro-esophageal reflux disease without esophagitis: Secondary | ICD-10-CM | POA: Diagnosis present

## 2020-12-25 LAB — CBC
HCT: 28.4 % — ABNORMAL LOW (ref 36.0–46.0)
Hemoglobin: 9.2 g/dL — ABNORMAL LOW (ref 12.0–15.0)
MCH: 27.6 pg (ref 26.0–34.0)
MCHC: 32.4 g/dL (ref 30.0–36.0)
MCV: 85.3 fL (ref 80.0–100.0)
Platelets: 444 10*3/uL — ABNORMAL HIGH (ref 150–400)
RBC: 3.33 MIL/uL — ABNORMAL LOW (ref 3.87–5.11)
RDW: 15 % (ref 11.5–15.5)
WBC: 9 10*3/uL (ref 4.0–10.5)
nRBC: 0 % (ref 0.0–0.2)

## 2020-12-25 LAB — C-REACTIVE PROTEIN: CRP: 16.9 mg/dL — ABNORMAL HIGH (ref <1.0)

## 2020-12-25 LAB — HIV ANTIBODY (ROUTINE TESTING W REFLEX): HIV Screen 4th Generation wRfx: NONREACTIVE

## 2020-12-25 LAB — PREGNANCY, URINE: Preg Test, Ur: POSITIVE — AB

## 2020-12-25 LAB — LACTIC ACID, PLASMA: Lactic Acid, Venous: 1 mmol/L (ref 0.5–1.9)

## 2020-12-25 LAB — COMPREHENSIVE METABOLIC PANEL
ALT: 26 U/L (ref 0–44)
AST: 30 U/L (ref 15–41)
Albumin: 2.7 g/dL — ABNORMAL LOW (ref 3.5–5.0)
Alkaline Phosphatase: 66 U/L (ref 38–126)
Anion gap: 11 (ref 5–15)
BUN: 11 mg/dL (ref 6–20)
CO2: 27 mmol/L (ref 22–32)
Calcium: 7.7 mg/dL — ABNORMAL LOW (ref 8.9–10.3)
Chloride: 102 mmol/L (ref 98–111)
Creatinine, Ser: 0.78 mg/dL (ref 0.44–1.00)
GFR, Estimated: >60 mL/min (ref >60)
Glucose, Bld: 87 mg/dL (ref 70–99)
Potassium: 3.3 mmol/L — ABNORMAL LOW (ref 3.5–5.1)
Sodium: 140 mmol/L (ref 135–145)
Total Bilirubin: 0.3 mg/dL (ref 0.3–1.2)
Total Protein: 6.1 g/dL — ABNORMAL LOW (ref 6.5–8.1)

## 2020-12-25 LAB — FERRITIN: Ferritin: 66 ng/mL (ref 11–307)

## 2020-12-25 LAB — PROCALCITONIN: Procalcitonin: 0.1 ng/mL

## 2020-12-25 LAB — RESP PANEL BY RT-PCR (FLU A&B, COVID) ARPGX2
Influenza A by PCR: NEGATIVE
Influenza B by PCR: NEGATIVE
SARS Coronavirus 2 by RT PCR: POSITIVE — AB

## 2020-12-25 LAB — LACTATE DEHYDROGENASE: LDH: 340 U/L — ABNORMAL HIGH (ref 98–192)

## 2020-12-25 LAB — D-DIMER, QUANTITATIVE: D-Dimer, Quant: 3.98 ug/mL-FEU — ABNORMAL HIGH (ref 0.00–0.50)

## 2020-12-25 LAB — TRIGLYCERIDES: Triglycerides: 110 mg/dL (ref <150)

## 2020-12-25 LAB — MAGNESIUM: Magnesium: 2 mg/dL (ref 1.7–2.4)

## 2020-12-25 LAB — FIBRINOGEN: Fibrinogen: 538 mg/dL — ABNORMAL HIGH (ref 210–475)

## 2020-12-25 MED ORDER — SODIUM CHLORIDE 0.9 % IV SOLN: 100.0000 mg | Freq: Every day | INTRAVENOUS | Status: DC

## 2020-12-25 MED ORDER — METHYLPREDNISOLONE SODIUM SUCC 125 MG IJ SOLR
0.5000 mg/kg | Freq: Two times a day (BID) | INTRAMUSCULAR | Status: DC
  Administered 2020-12-26 – 2020-12-27 (×3): 51.25 mg via INTRAVENOUS
  Filled 2020-12-25 (×3): qty 2

## 2020-12-25 MED ORDER — REMDESIVIR 100 MG IV SOLR
INTRAVENOUS | Status: AC
  Filled 2020-12-25: qty 40

## 2020-12-25 MED ORDER — ACETAMINOPHEN 325 MG PO TABS
650.0000 mg | ORAL_TABLET | Freq: Once | ORAL | Status: AC
  Administered 2020-12-25: 650 mg via ORAL
  Filled 2020-12-25: qty 2

## 2020-12-25 MED ORDER — ALBUTEROL SULFATE HFA 108 (90 BASE) MCG/ACT IN AERS
2.0000 | INHALATION_SPRAY | Freq: Four times a day (QID) | RESPIRATORY_TRACT | Status: DC
  Administered 2020-12-26 – 2020-12-27 (×5): 2 via RESPIRATORY_TRACT
  Filled 2020-12-25: qty 6.7

## 2020-12-25 MED ORDER — POTASSIUM CHLORIDE CRYS ER 20 MEQ PO TBCR
40.0000 meq | EXTENDED_RELEASE_TABLET | Freq: Once | ORAL | Status: AC
  Administered 2020-12-25: 40 meq via ORAL
  Filled 2020-12-25: qty 2

## 2020-12-25 MED ORDER — DEXAMETHASONE SODIUM PHOSPHATE 10 MG/ML IJ SOLN
6.0000 mg | INTRAMUSCULAR | Status: DC
  Administered 2020-12-25: 6 mg via INTRAVENOUS
  Filled 2020-12-25: qty 1

## 2020-12-25 MED ORDER — SODIUM CHLORIDE 0.9 % IV SOLN
200.0000 mg | Freq: Once | INTRAVENOUS | Status: DC
  Administered 2020-12-25: 200 mg via INTRAVENOUS
  Filled 2020-12-25: qty 40

## 2020-12-25 MED ORDER — HYDROCOD POLST-CPM POLST ER 10-8 MG/5ML PO SUER: 5.0000 mL | Freq: Two times a day (BID) | ORAL | Status: DC | PRN

## 2020-12-25 MED ORDER — ALBUTEROL SULFATE HFA 108 (90 BASE) MCG/ACT IN AERS
2.0000 | INHALATION_SPRAY | RESPIRATORY_TRACT | Status: DC | PRN
  Administered 2020-12-25: 2 via RESPIRATORY_TRACT
  Filled 2020-12-25: qty 6.7

## 2020-12-25 MED ORDER — IOHEXOL 350 MG/ML SOLN
100.0000 mL | Freq: Once | INTRAVENOUS | Status: AC | PRN
  Administered 2020-12-25: 85 mL via INTRAVENOUS

## 2020-12-25 MED ORDER — PREDNISONE 50 MG PO TABS: 50.0000 mg | ORAL_TABLET | Freq: Every day | ORAL | Status: DC

## 2020-12-25 MED ORDER — SODIUM CHLORIDE 0.9 % IV SOLN
200.0000 mg | Freq: Once | INTRAVENOUS | Status: DC
  Filled 2020-12-25: qty 40

## 2020-12-25 MED ORDER — SODIUM CHLORIDE 0.9 % IV SOLN: 100.0000 mg | INTRAVENOUS | Status: DC

## 2020-12-25 NOTE — ED Notes (Signed)
PT

## 2020-12-25 NOTE — ED Notes (Signed)
Pt oxygen dipping down to 87-90%; this RN placed on 6L [place] without result; informed RT and EDP; RT to evaluate pt

## 2020-12-25 NOTE — ED Provider Notes (Signed)
[facility] EMERGENCY DEPARTMENT Provider Note   CSN: [id number] Arrival date & time: 12/25/20  1426     History Chief Complaint  Patient presents with  . Shortness of Breath    COVID +    Kathryn Buck is a 35 y.o. female.  The history is provided by the patient.  Shortness of Breath Severity:  Moderate Onset quality:  Gradual Timing:  Constant Progression:  Worsening Chronicity:  New Context: URI (Covid positve 4 days ago. 10 days postpartum on lovenox for ppx due to hx of blood clots.)   Relieved by:  Nothing Worsened by:  Activity Associated symptoms: cough and fever   Associated symptoms: no abdominal pain, no chest pain, no ear pain, no rash, no sore throat and no vomiting   Risk factors: hx of PE/DVT        Past Medical History:  Diagnosis Date  . Diabetes mellitus without complication (HCC)    Type 2  . DVT (deep venous thrombosis) ([place])   . Gestational diabetes    metformin  . History of chlamydia   . History of gonorrhea   . Hypertension    labetalol   . Hypothyroidism    synthroid  . STD (sexually transmitted disease)   . Vaginal Pap smear, abnormal     Patient Active Problem List   Diagnosis Date Noted  . Cesarean delivery delivered 12/17/2020  . Normal postpartum course 12/17/2020  . Acute blood loss anemia 12/17/2020  . Type 2 diabetes mellitus affecting pregnancy in third trimester, antepartum 12/14/2020  . Abnormal glucose tolerance test (GTT) during pregnancy, antepartum 06/10/2020  . Hypertension 11/06/2017  . Hypothyroidism 11/06/2017  . Obesity 09/15/2012  . DVT (deep venous thrombosis) ([place]) 09/06/2011  . Cellulitis and abscess 09/06/2011    Past Surgical History:  Procedure Laterality Date  . CESAREAN SECTION N/A 12/15/2020   Procedure: CESAREAN SECTION;  Surgeon: Drema Dallas, DO;  Location: [facility];  Service: Obstetrics;  Laterality: N/A;  . EXPLORATORY LAPAROTOMY     left ovarian cystectomy dermoid 2014  .  LAPAROTOMY Left 08/10/2013   Procedure: EXPLORATORY LAPAROTOMY; LEFT OVARIAN CYSTECTOMY;  Surgeon: Terrance Mass, MD;  Location: [facility];  Service: Gynecology;  Laterality: Left;  . OVARIAN CYST SURGERY    . WISDOM TOOTH EXTRACTION       OB History    Gravida  1   Para  1   Term  1   Preterm      AB      Living  1     SAB      IAB      Ectopic      Multiple  0   Live Births  1           Family History  Problem Relation Age of Onset  . Diabetes Mother   . Heart disease Mother   . Hypertension Mother   . Hypertension Father   . Diabetes Sister     Social History   Tobacco Use  . Smoking status: Never Smoker  . Smokeless tobacco: Never Used  Vaping Use  . Vaping Use: Never used  Substance Use Topics  . Alcohol use: Yes    Alcohol/week: 0.0 standard drinks    Comment: occasional  . Drug use: No    Home Medications Prior to Admission medications   Medication Sig Start Date End Date Taking? Authorizing Provider  acetaminophen (TYLENOL) 325 MG tablet Take 2 tablets (650 mg total)  by mouth every 4 (four) hours as needed for mild pain (temperature > 101.5.). 12/17/20   Noralyn Pick, FNP  acetaminophen (TYLENOL) 500 MG tablet Take 500 mg by mouth every 8 (eight) hours as needed for mild pain.    [provider]  cetirizine (ZYRTEC) 10 MG tablet Take 10 mg by mouth daily as needed for allergies.    [provider]  iron polysaccharides (NIFEREX) 150 MG capsule Take 1 capsule (150 mg total) by mouth daily. 12/17/20   Noralyn Pick, FNP  labetalol (NORMODYNE) 200 MG tablet Take 200 mg by mouth 2 (two) times daily.    [provider]  levothyroxine (LEVOTHROID) 25 MCG tablet Take 1 tablet (25 mcg total) by mouth daily before breakfast. 11/10/19   Huel Cote, NP  metFORMIN (GLUCOPHAGE) 500 MG tablet Take 500 mg by mouth 2 (two) times daily with a meal.    [provider]  oxyCODONE (OXY IR/ROXICODONE) 5 MG immediate release  tablet Take 1 tablet (5 mg total) by mouth every 4 (four) hours as needed for moderate pain. 12/17/20   Noralyn Pick, [place]  Prenatal Vit-Fe Fumarate-FA (PRENATAL MULTIVITAMIN) TABS tablet Take 1 tablet by mouth daily at 12 noon.    [provider]  pyridOXINE (VITAMIN B-6) 50 MG tablet Take 1 tablet (50 mg total) by mouth daily. Patient not taking: No sig reported 05/06/20   Joseph Pierini, MD  senna (SENOKOT) 8.6 MG TABS tablet Take 1 tablet by mouth daily as needed for mild constipation.    [provider]  senna-docusate (SENOKOT-S) 8.6-50 MG tablet Take 2 tablets by mouth daily. 12/17/20   Noralyn Pick, FNP  valACYclovir (VALTREX) 500 MG tablet Take 500 mg by mouth 2 (two) times daily.    [provider]    Allergies    Patient has no known allergies.  Review of Systems   Review of Systems  Constitutional: Positive for fever. Negative for chills.  HENT: Negative for ear pain and sore throat.   Eyes: Negative for pain and visual disturbance.  Respiratory: Positive for cough and shortness of breath.   Cardiovascular: Negative for chest pain and palpitations.  Gastrointestinal: Negative for abdominal pain and vomiting.  Genitourinary: Negative for dysuria and hematuria.  Musculoskeletal: Negative for arthralgias and back pain.  Skin: Negative for color change and rash.  Neurological: Negative for seizures and syncope.  All other systems reviewed and are negative.   Physical Exam Updated Vital Signs  ED Triage Vitals  Enc Vitals Group     BP 12/25/20 1450 136/83     Pulse Rate 12/25/20 1450 (!) 101     Resp 12/25/20 1450 (!) 26     Temp 12/25/20 1455 (!) 100.6 F (38.1 C)     Temp Source 12/25/20 1455 Oral     SpO2 12/25/20 1450 (!) 82 %     Weight 12/25/20 1455 225 lb (102.1 kg)     Height 12/25/20 1455 5\' 2"  (1.575 m)     Head Circumference --      Peak Flow --      Pain Score 12/25/20 1454 0     Pain Loc --      Pain Edu? --      Excl. in [place]?  --     Physical Exam Vitals and nursing note reviewed.  Constitutional:      General: She is not in acute distress.    Appearance: She is well-developed and well-nourished. She is not ill-appearing.  HENT:     Head: Normocephalic and atraumatic.  Eyes:     Conjunctiva/sclera: Conjunctivae normal.     Pupils: Pupils are equal, round, and reactive to light.  Cardiovascular:     Rate and Rhythm: Normal rate and regular rhythm.     Pulses: Normal pulses.     Heart sounds: Normal heart sounds. No murmur heard.   Pulmonary:     Effort: Tachypnea present. No respiratory distress.     Breath sounds: Decreased breath sounds present.  Abdominal:     Palpations: Abdomen is soft.     Tenderness: There is no abdominal tenderness.  Musculoskeletal:     Cervical back: Normal range of motion and neck supple.     Right lower leg: Edema present.     Left lower leg: Edema present.  Skin:    General: Skin is warm and dry.     Capillary Refill: Capillary refill takes less than 2 seconds.  Neurological:     General: No focal deficit present.     Mental Status: She is alert.  Psychiatric:        Mood and Affect: Mood and affect normal.     ED Results / Procedures / Treatments   Labs (all labs ordered are listed, but only abnormal results are displayed) Labs Reviewed  RESP PANEL BY RT-PCR (FLU A&B, COVID) ARPGX2 - Abnormal; Notable for the following components:      Result Value   SARS Coronavirus 2 by RT PCR POSITIVE (*)    All other components within normal limits  CBC - Abnormal; Notable for the following components:   RBC 3.33 (*)    Hemoglobin 9.2 (*)    HCT 28.4 (*)    Platelets 444 (*)    All other components within normal limits  PREGNANCY, URINE - Abnormal; Notable for the following components:   Preg Test, Ur POSITIVE (*)    All other components within normal limits  COMPREHENSIVE METABOLIC PANEL - Abnormal; Notable for the following components:   Potassium 3.3 (*)     Calcium 7.7 (*)    Total Protein 6.1 (*)    Albumin 2.7 (*)    All other components within normal limits  D-DIMER, QUANTITATIVE (NOT AT [facility]) - Abnormal; Notable for the following components:   D-Dimer, Quant 3.98 (*)    All other components within normal limits  CULTURE, BLOOD (ROUTINE X 2)  CULTURE, BLOOD (ROUTINE X 2)  LACTIC ACID, PLASMA  PROCALCITONIN  LACTATE DEHYDROGENASE  FERRITIN  TRIGLYCERIDES  C-REACTIVE PROTEIN  HIV ANTIBODY (ROUTINE TESTING W REFLEX)  FIBRINOGEN    EKG EKG Interpretation  Date/Time:  Sunday December 25 2020 15:31:59 EST Ventricular Rate:  90 PR Interval:    QRS Duration: 88 QT Interval:  367 QTC Calculation: 449 R Axis:   22 Text Interpretation: Sinus rhythm Baseline wander in lead(s) V6 Confirmed by Lennice Sites [phone]) on 12/25/2020 4:37:54 PM   Radiology CT Angio Chest PE W and/or Wo Contrast  Result Date: 12/25/2020 CLINICAL DATA:  Postpartum. Coronavirus infection. Worsening shortness of breath. EXAM: CT ANGIOGRAPHY CHEST WITH CONTRAST TECHNIQUE: Multidetector CT imaging of the chest was performed using the standard protocol during bolus administration of intravenous contrast. Multiplanar CT image reconstructions and MIPs were obtained to evaluate the vascular anatomy. CONTRAST:  37mL OMNIPAQUE IOHEXOL 350 MG/ML SOLN COMPARISON:  Radiography same day FINDINGS: Cardiovascular: The heart is mildly enlarged. Tiny amount of pericardial fluid. No aortic pathology. Pulmonary arterial opacification is good. There are  no pulmonary emboli. Mediastinum/Nodes: No mass or lymphadenopathy. Lungs/Pleura: Bilateral pleural effusions layering dependently. Extensive consolidation throughout both lower lobes. More patchy infiltrate within the upper lobes. Moderate involvement of the right middle lobe. Upper Abdomen: Negative Musculoskeletal: Normal Review of the MIP images confirms the above findings. IMPRESSION: 1. No pulmonary emboli. 2. Bilateral pleural effusions  layering dependently. Extensive consolidation throughout both lower lobes and the right middle lobe. More patchy infiltrate within the upper lobes. Findings consistent with widespread pneumonia. Electronically Signed   By: Nelson Chimes M.D.   On: 12/25/2020 17:29   DG Chest Port 1 View  Result Date: 12/25/2020 CLINICAL DATA:  Shortness of breath for 3 days. COVID positive. Recent Caesarean section. EXAM: PORTABLE CHEST 1 VIEW COMPARISON:  None. FINDINGS: Low lung volumes. Upper normal heart size. Hazy bilateral lung base opacities likely combination pleural effusions and airspace disease. Additional patchy opacity in the perihilar region and left mid lung. Mild diffuse peribronchial thickening. No pneumothorax. No acute osseous abnormalities are seen. IMPRESSION: 1. Low lung volumes with borderline cardiomegaly and hazy bilateral lung base opacities likely combination pleural effusions and airspace disease. 2. Patchy perihilar and left midlung opacities, edema versus multifocal infection. Electronically Signed   By: Keith Rake M.D.   On: 12/25/2020 16:15    Procedures .Critical Care Performed by: Lennice Sites, DO Authorized by: Lennice Sites, DO   Critical care provider statement:    Critical care time (minutes):  35   Critical care was necessary to treat or prevent imminent or life-threatening deterioration of the following conditions:  Respiratory failure   Critical care was time spent personally by me on the following activities:  Blood draw for specimens, development of treatment plan with patient or surrogate, discussions with primary provider, evaluation of patient's response to treatment, examination of patient, obtaining history from patient or surrogate, ordering and performing treatments and interventions, ordering and review of laboratory studies, pulse oximetry, ordering and review of radiographic studies, re-evaluation of patient's condition and review of old charts   I assumed  direction of critical care for this patient from another provider in my specialty: no     (including critical care time)  Medications Ordered in ED Medications  albuterol (VENTOLIN HFA) 108 (90 Base) MCG/ACT inhaler 2 puff (2 puffs Inhalation Given 12/25/20 1543)  dexamethasone (DECADRON) injection 6 mg (6 mg Intravenous Given 12/25/20 1546)  remdesivir 100 mg injection (has no administration in time range)  remdesivir 200 mg in sodium chloride 0.9% 250 mL IVPB (200 mg Intravenous Not Given 12/25/20 1604)    Followed by  remdesivir 100 mg in sodium chloride 0.9 % 100 mL IVPB (has no administration in time range)  acetaminophen (TYLENOL) tablet 650 mg (650 mg Oral Given 12/25/20 1558)  iohexol (OMNIPAQUE) 350 MG/ML injection 100 mL (85 mLs Intravenous Contrast Given 12/25/20 1706)    ED Course  I have reviewed the triage vital signs and the nursing notes.  Pertinent labs & imaging results that were available during my care of the patient were reviewed by me and considered in my medical decision making (see chart for details).    MDM Rules/Calculators/A&P                          Alishia EARMA YIELDING is a 35 year old female, 10 days postpartum, on Lovenox for DVT prevention who presents the ED with Covid, shortness of breath.  Patient hypoxic to the low 80s upon arrival.  Improved on  3 L of oxygen.  Patient febrile.  Home positive Covid test 4 days ago.  Was recently in the hospital where she gave birth and has a 63-day-old.  She is not vaccinated.  Chest x-ray consistent with Covid pneumonia.  Otherwise lab work is unremarkable.  Some inflammatory markers elevated.  Given her history of blood clots a CT scan was done that showed no PE but did confirm multifocal pneumonia.  Patient to be admitted to medicine for further care.  IV steroids and IV remdesivir have been ordered.  This chart was dictated using voice recognition software.  Despite best efforts to proofread,  errors can occur which can change the  documentation meaning.    Final Clinical Impression(s) / ED Diagnoses Final diagnoses:  COVID  Acute respiratory failure with hypoxia [facility])    Rx / DC Orders ED Discharge Orders    None       Lennice Sites, DO 12/25/20 1743

## 2020-12-25 NOTE — Plan of Care (Signed)
35 yo F non-vaccinated 10 days post partum On Lovenox for prior hx of DVT Tested positive fr COVID Now on 4 of O2

## 2020-12-25 NOTE — ED Notes (Signed)
Triage full, Working to find location to obtain EKG Per protocols.

## 2020-12-25 NOTE — ED Triage Notes (Signed)
Pt COVID + with home test Thursday, symptoms started Wednesday. Complaining of shortness of breath and fever. 82% on RA, improvement to 95% on 4L O2 [place]

## 2020-12-25 NOTE — ED Notes (Signed)
RT assessed patient in triage. BBS dlightly diminished bases, SAT not 95% on 4L (84% on RA.) Will assess further once roomed.

## 2020-12-25 NOTE — ED Notes (Signed)
Sitting upright on stretcher at this time with no distress noted.  Encouraged to call for assistance.  Call bell within reach with bed in lowest position.

## 2020-12-25 NOTE — ED Notes (Signed)
Oxygen level noted to stay around 85% on 3L.  Increased to 4L.  After approximately 10 minutes sat up to 95-98%.  Sitting up on stretcher eating at this time.  Encouraged to call for assistance as needed.

## 2020-12-25 NOTE — ED Notes (Signed)
RT placed pt on 8L HFNC

## 2020-12-25 NOTE — ED Notes (Signed)
Pt ambulatory to room with 4L [place], maintainted SpO2 of 94% with ambulation

## 2020-12-26 DIAGNOSIS — Z7901 Long term (current) use of anticoagulants: Secondary | ICD-10-CM | POA: Diagnosis not present

## 2020-12-26 DIAGNOSIS — K219 Gastro-esophageal reflux disease without esophagitis: Secondary | ICD-10-CM | POA: Diagnosis present

## 2020-12-26 DIAGNOSIS — O9853 Other viral diseases complicating the puerperium: Secondary | ICD-10-CM | POA: Diagnosis present

## 2020-12-26 DIAGNOSIS — R7989 Other specified abnormal findings of blood chemistry: Secondary | ICD-10-CM | POA: Diagnosis not present

## 2020-12-26 DIAGNOSIS — E039 Hypothyroidism, unspecified: Secondary | ICD-10-CM | POA: Diagnosis present

## 2020-12-26 DIAGNOSIS — J1282 Pneumonia due to coronavirus disease 2019: Secondary | ICD-10-CM | POA: Diagnosis present

## 2020-12-26 DIAGNOSIS — Z7984 Long term (current) use of oral hypoglycemic drugs: Secondary | ICD-10-CM | POA: Diagnosis not present

## 2020-12-26 DIAGNOSIS — I5033 Acute on chronic diastolic (congestive) heart failure: Secondary | ICD-10-CM | POA: Diagnosis present

## 2020-12-26 DIAGNOSIS — U071 COVID-19: Secondary | ICD-10-CM | POA: Diagnosis present

## 2020-12-26 DIAGNOSIS — R0602 Shortness of breath: Secondary | ICD-10-CM | POA: Diagnosis present

## 2020-12-26 DIAGNOSIS — E669 Obesity, unspecified: Secondary | ICD-10-CM | POA: Diagnosis present

## 2020-12-26 DIAGNOSIS — Z79890 Hormone replacement therapy (postmenopausal): Secondary | ICD-10-CM | POA: Diagnosis not present

## 2020-12-26 DIAGNOSIS — I10 Essential (primary) hypertension: Secondary | ICD-10-CM | POA: Diagnosis present

## 2020-12-26 DIAGNOSIS — Z86718 Personal history of other venous thrombosis and embolism: Secondary | ICD-10-CM | POA: Diagnosis not present

## 2020-12-26 DIAGNOSIS — J9601 Acute respiratory failure with hypoxia: Secondary | ICD-10-CM | POA: Diagnosis present

## 2020-12-26 LAB — COMPREHENSIVE METABOLIC PANEL
ALT: 26 U/L (ref 0–44)
AST: 28 U/L (ref 15–41)
Albumin: 2.6 g/dL — ABNORMAL LOW (ref 3.5–5.0)
Alkaline Phosphatase: 62 U/L (ref 38–126)
Anion gap: 10 (ref 5–15)
BUN: 11 mg/dL (ref 6–20)
CO2: 24 mmol/L (ref 22–32)
Calcium: 8.3 mg/dL — ABNORMAL LOW (ref 8.9–10.3)
Chloride: 105 mmol/L (ref 98–111)
Creatinine, Ser: 0.68 mg/dL (ref 0.44–1.00)
GFR, Estimated: >60 mL/min (ref >60)
Glucose, Bld: 97 mg/dL (ref 70–99)
Potassium: 4.2 mmol/L (ref 3.5–5.1)
Sodium: 139 mmol/L (ref 135–145)
Total Bilirubin: 0.3 mg/dL (ref 0.3–1.2)
Total Protein: 6.1 g/dL — ABNORMAL LOW (ref 6.5–8.1)

## 2020-12-26 LAB — PHOSPHORUS: Phosphorus: 3.4 mg/dL (ref 2.5–4.6)

## 2020-12-26 LAB — CBC WITH DIFFERENTIAL/PLATELET
Abs Immature Granulocytes: 0.04 10*3/uL (ref 0.00–0.07)
Basophils Absolute: 0 10*3/uL (ref 0.0–0.1)
Basophils Relative: 0 %
Eosinophils Absolute: 0 10*3/uL (ref 0.0–0.5)
Eosinophils Relative: 0 %
HCT: 27.9 % — ABNORMAL LOW (ref 36.0–46.0)
Hemoglobin: 8.9 g/dL — ABNORMAL LOW (ref 12.0–15.0)
Immature Granulocytes: 1 %
Lymphocytes Relative: 16 %
Lymphs Abs: 1.2 10*3/uL (ref 0.7–4.0)
MCH: 27.1 pg (ref 26.0–34.0)
MCHC: 31.9 g/dL (ref 30.0–36.0)
MCV: 85.1 fL (ref 80.0–100.0)
Monocytes Absolute: 0.1 10*3/uL (ref 0.1–1.0)
Monocytes Relative: 2 %
Neutro Abs: 6 10*3/uL (ref 1.7–7.7)
Neutrophils Relative %: 81 %
Platelets: 264 10*3/uL (ref 150–400)
RBC: 3.28 MIL/uL — ABNORMAL LOW (ref 3.87–5.11)
RDW: 15 % (ref 11.5–15.5)
WBC: 7.4 10*3/uL (ref 4.0–10.5)
nRBC: 0 % (ref 0.0–0.2)

## 2020-12-26 LAB — MAGNESIUM: Magnesium: 2.1 mg/dL (ref 1.7–2.4)

## 2020-12-26 LAB — C-REACTIVE PROTEIN: CRP: 17.9 mg/dL — ABNORMAL HIGH (ref <1.0)

## 2020-12-26 LAB — D-DIMER, QUANTITATIVE: D-Dimer, Quant: 3.3 ug/mL-FEU — ABNORMAL HIGH (ref 0.00–0.50)

## 2020-12-26 LAB — FERRITIN: Ferritin: 62 ng/mL (ref 11–307)

## 2020-12-26 MED ORDER — SODIUM CHLORIDE 0.9 % IV SOLN
100.0000 mg | Freq: Every day | INTRAVENOUS | Status: AC
  Administered 2020-12-26 – 2020-12-29 (×4): 100 mg via INTRAVENOUS
  Filled 2020-12-26 (×3): qty 20

## 2020-12-26 MED ORDER — ACETAMINOPHEN 325 MG PO TABS: 650.0000 mg | ORAL_TABLET | Freq: Once | ORAL | Status: AC

## 2020-12-26 MED ORDER — ENOXAPARIN SODIUM 40 MG/0.4ML ~~LOC~~ SOLN
40.0000 mg | Freq: Every day | SUBCUTANEOUS | Status: DC
  Administered 2020-12-26: 40 mg via SUBCUTANEOUS
  Filled 2020-12-26: qty 0.4

## 2020-12-26 MED ORDER — ACETAMINOPHEN 325 MG PO TABS
ORAL_TABLET | ORAL | Status: AC
  Administered 2020-12-26: 650 mg via ORAL
  Filled 2020-12-26: qty 2

## 2020-12-26 MED ORDER — SODIUM CHLORIDE 0.9 % IV SOLN
INTRAVENOUS | Status: DC | PRN
  Administered 2020-12-26: 250 mL via INTRAVENOUS

## 2020-12-26 NOTE — Patient Instructions (Signed)
Patient educated on the proper use of the flutter valve. Patient instructed to use Q1HR X 10 WA. Patient able to demonstrate effective use  And promote coughing. Patient tolerated well.

## 2020-12-26 NOTE — ED Notes (Addendum)
Assisted up to recliner. Given 'sponge bath" watching TV , snacks and po fluids at bedside table

## 2020-12-26 NOTE — ED Notes (Signed)
RT at bedside; gave pt flutter valve

## 2020-12-26 NOTE — ED Notes (Signed)
Instructed patient on the proper use of a flutter valve.  Patient able to perform effectively. Instructed to use device Q1hr WA. Patient tolerated well.

## 2020-12-27 ENCOUNTER — Encounter (HOSPITAL_COMMUNITY): Payer: Self-pay | Admitting: Internal Medicine

## 2020-12-27 DIAGNOSIS — O24419 Gestational diabetes mellitus in pregnancy, unspecified control: Secondary | ICD-10-CM

## 2020-12-27 DIAGNOSIS — J9601 Acute respiratory failure with hypoxia: Secondary | ICD-10-CM

## 2020-12-27 DIAGNOSIS — E039 Hypothyroidism, unspecified: Secondary | ICD-10-CM

## 2020-12-27 DIAGNOSIS — J1282 Pneumonia due to coronavirus disease 2019: Secondary | ICD-10-CM

## 2020-12-27 DIAGNOSIS — U071 COVID-19: Secondary | ICD-10-CM

## 2020-12-27 DIAGNOSIS — I10 Essential (primary) hypertension: Secondary | ICD-10-CM

## 2020-12-27 DIAGNOSIS — Z86718 Personal history of other venous thrombosis and embolism: Secondary | ICD-10-CM

## 2020-12-27 DIAGNOSIS — O24415 Gestational diabetes mellitus in pregnancy, controlled by oral hypoglycemic drugs: Secondary | ICD-10-CM

## 2020-12-27 DIAGNOSIS — R0902 Hypoxemia: Secondary | ICD-10-CM

## 2020-12-27 LAB — C-REACTIVE PROTEIN: CRP: 9.7 mg/dL — ABNORMAL HIGH (ref <1.0)

## 2020-12-27 LAB — COMPREHENSIVE METABOLIC PANEL
ALT: 33 U/L (ref 0–44)
AST: 32 U/L (ref 15–41)
Albumin: 2.6 g/dL — ABNORMAL LOW (ref 3.5–5.0)
Alkaline Phosphatase: 57 U/L (ref 38–126)
Anion gap: 9 (ref 5–15)
BUN: 16 mg/dL (ref 6–20)
CO2: 25 mmol/L (ref 22–32)
Calcium: 8.3 mg/dL — ABNORMAL LOW (ref 8.9–10.3)
Chloride: 106 mmol/L (ref 98–111)
Creatinine, Ser: 0.79 mg/dL (ref 0.44–1.00)
GFR, Estimated: >60 mL/min (ref >60)
Glucose, Bld: 156 mg/dL — ABNORMAL HIGH (ref 70–99)
Potassium: 3.7 mmol/L (ref 3.5–5.1)
Sodium: 140 mmol/L (ref 135–145)
Total Bilirubin: 0.3 mg/dL (ref 0.3–1.2)
Total Protein: 6 g/dL — ABNORMAL LOW (ref 6.5–8.1)

## 2020-12-27 LAB — CBC WITH DIFFERENTIAL/PLATELET
Abs Immature Granulocytes: 0.08 10*3/uL — ABNORMAL HIGH (ref 0.00–0.07)
Basophils Absolute: 0 10*3/uL (ref 0.0–0.1)
Basophils Relative: 0 %
Eosinophils Absolute: 0 10*3/uL (ref 0.0–0.5)
Eosinophils Relative: 0 %
HCT: 29.1 % — ABNORMAL LOW (ref 36.0–46.0)
Hemoglobin: 9.2 g/dL — ABNORMAL LOW (ref 12.0–15.0)
Immature Granulocytes: 1 %
Lymphocytes Relative: 12 %
Lymphs Abs: 1.1 10*3/uL (ref 0.7–4.0)
MCH: 27.4 pg (ref 26.0–34.0)
MCHC: 31.6 g/dL (ref 30.0–36.0)
MCV: 86.6 fL (ref 80.0–100.0)
Monocytes Absolute: 0.2 10*3/uL (ref 0.1–1.0)
Monocytes Relative: 2 %
Neutro Abs: 7.9 10*3/uL — ABNORMAL HIGH (ref 1.7–7.7)
Neutrophils Relative %: 85 %
Platelets: 464 10*3/uL — ABNORMAL HIGH (ref 150–400)
RBC: 3.36 MIL/uL — ABNORMAL LOW (ref 3.87–5.11)
RDW: 14.7 % (ref 11.5–15.5)
WBC: 9.3 10*3/uL (ref 4.0–10.5)
nRBC: 0 % (ref 0.0–0.2)

## 2020-12-27 LAB — CREATININE, SERUM
Creatinine, Ser: 0.69 mg/dL (ref 0.44–1.00)
GFR, Estimated: >60 mL/min (ref >60)

## 2020-12-27 LAB — GLUCOSE, CAPILLARY
Glucose-Capillary: 110 mg/dL — ABNORMAL HIGH (ref 70–99)
Glucose-Capillary: 169 mg/dL — ABNORMAL HIGH (ref 70–99)
Glucose-Capillary: 117 mg/dL — ABNORMAL HIGH (ref 70–99)

## 2020-12-27 LAB — PHOSPHORUS: Phosphorus: 3.7 mg/dL (ref 2.5–4.6)

## 2020-12-27 LAB — FERRITIN: Ferritin: 51 ng/mL (ref 11–307)

## 2020-12-27 LAB — D-DIMER, QUANTITATIVE: D-Dimer, Quant: 3.13 ug/mL-FEU — ABNORMAL HIGH (ref 0.00–0.50)

## 2020-12-27 LAB — MAGNESIUM: Magnesium: 2 mg/dL (ref 1.7–2.4)

## 2020-12-27 MED ORDER — POLYSACCHARIDE IRON COMPLEX 150 MG PO CAPS
150.0000 mg | ORAL_CAPSULE | Freq: Every day | ORAL | Status: DC
  Administered 2020-12-27 – 2020-12-30 (×4): 150 mg via ORAL
  Filled 2020-12-27 (×4): qty 1

## 2020-12-27 MED ORDER — LORATADINE 10 MG PO TABS
10.0000 mg | ORAL_TABLET | Freq: Every day | ORAL | Status: DC
  Administered 2020-12-27 – 2020-12-30 (×4): 10 mg via ORAL
  Filled 2020-12-27 (×4): qty 1

## 2020-12-27 MED ORDER — OXYCODONE HCL 5 MG PO TABS: 5.0000 mg | ORAL_TABLET | ORAL | Status: DC | PRN

## 2020-12-27 MED ORDER — INSULIN ASPART 100 UNIT/ML ~~LOC~~ SOLN: 0.0000 [IU] | Freq: Every day | SUBCUTANEOUS | Status: DC

## 2020-12-27 MED ORDER — LABETALOL HCL 200 MG PO TABS
200.0000 mg | ORAL_TABLET | Freq: Two times a day (BID) | ORAL | Status: DC
  Administered 2020-12-27 – 2020-12-30 (×7): 200 mg via ORAL
  Filled 2020-12-27 (×8): qty 1

## 2020-12-27 MED ORDER — LINAGLIPTIN 5 MG PO TABS
5.0000 mg | ORAL_TABLET | Freq: Every day | ORAL | Status: DC
  Administered 2020-12-27 – 2020-12-29 (×3): 5 mg via ORAL
  Filled 2020-12-27 (×3): qty 1

## 2020-12-27 MED ORDER — IPRATROPIUM-ALBUTEROL 20-100 MCG/ACT IN AERS
2.0000 | INHALATION_SPRAY | Freq: Four times a day (QID) | RESPIRATORY_TRACT | Status: DC
  Administered 2020-12-27 – 2020-12-29 (×8): 2 via RESPIRATORY_TRACT
  Filled 2020-12-27: qty 4

## 2020-12-27 MED ORDER — TOCILIZUMAB 400 MG/20ML IV SOLN
800.0000 mg | Freq: Once | INTRAVENOUS | Status: AC
  Administered 2020-12-27: 800 mg via INTRAVENOUS
  Filled 2020-12-27: qty 40

## 2020-12-27 MED ORDER — BARICITINIB 2 MG PO TABS: 4.0000 mg | ORAL_TABLET | Freq: Every day | ORAL | Status: DC

## 2020-12-27 MED ORDER — PRENATAL MULTIVITAMIN CH
1.0000 | ORAL_TABLET | Freq: Every day | ORAL | Status: DC
  Administered 2020-12-27 – 2020-12-30 (×4): 1 via ORAL
  Filled 2020-12-27 (×4): qty 1

## 2020-12-27 MED ORDER — INSULIN ASPART 100 UNIT/ML ~~LOC~~ SOLN
0.0000 [IU] | Freq: Three times a day (TID) | SUBCUTANEOUS | Status: DC
  Administered 2020-12-27 – 2020-12-29 (×3): 4 [IU] via SUBCUTANEOUS
  Administered 2020-12-30 (×2): 3 [IU] via SUBCUTANEOUS

## 2020-12-27 MED ORDER — PROCHLORPERAZINE EDISYLATE 10 MG/2ML IJ SOLN
10.0000 mg | Freq: Four times a day (QID) | INTRAMUSCULAR | Status: DC | PRN
  Administered 2020-12-27: 10 mg via INTRAVENOUS
  Filled 2020-12-27: qty 2

## 2020-12-27 MED ORDER — METHYLPREDNISOLONE SODIUM SUCC 125 MG IJ SOLR
60.0000 mg | Freq: Two times a day (BID) | INTRAMUSCULAR | Status: DC
  Administered 2020-12-28 – 2020-12-30 (×5): 60 mg via INTRAVENOUS
  Filled 2020-12-27 (×5): qty 2

## 2020-12-27 MED ORDER — ENOXAPARIN SODIUM 40 MG/0.4ML ~~LOC~~ SOLN: 40.0000 mg | SUBCUTANEOUS | Status: DC

## 2020-12-27 MED ORDER — LEVOTHYROXINE SODIUM 25 MCG PO TABS
25.0000 ug | ORAL_TABLET | Freq: Every day | ORAL | Status: DC
  Administered 2020-12-27 – 2020-12-30 (×4): 25 ug via ORAL
  Filled 2020-12-27 (×4): qty 1

## 2020-12-27 MED ORDER — METHYLPREDNISOLONE SODIUM SUCC 125 MG IJ SOLR
60.0000 mg | Freq: Two times a day (BID) | INTRAMUSCULAR | Status: DC
  Administered 2020-12-27: 60 mg via INTRAVENOUS
  Filled 2020-12-27: qty 2

## 2020-12-27 MED ORDER — LACTATED RINGERS IV SOLN: INTRAVENOUS | Status: DC

## 2020-12-27 MED ORDER — SENNOSIDES-DOCUSATE SODIUM 8.6-50 MG PO TABS: 1.0000 | ORAL_TABLET | Freq: Every evening | ORAL | Status: DC | PRN

## 2020-12-27 MED ORDER — ENOXAPARIN SODIUM 40 MG/0.4ML ~~LOC~~ SOLN
40.0000 mg | Freq: Two times a day (BID) | SUBCUTANEOUS | Status: DC
  Administered 2020-12-27 – 2020-12-30 (×7): 40 mg via SUBCUTANEOUS
  Filled 2020-12-27 (×7): qty 0.4

## 2020-12-27 MED ORDER — SENNOSIDES-DOCUSATE SODIUM 8.6-50 MG PO TABS
2.0000 | ORAL_TABLET | Freq: Every day | ORAL | Status: DC
  Administered 2020-12-27 – 2020-12-29 (×2): 2 via ORAL
  Filled 2020-12-27 (×4): qty 2

## 2020-12-27 MED ORDER — VALACYCLOVIR HCL 500 MG PO TABS
500.0000 mg | ORAL_TABLET | Freq: Two times a day (BID) | ORAL | Status: DC
  Administered 2020-12-27 – 2020-12-30 (×7): 500 mg via ORAL
  Filled 2020-12-27 (×7): qty 1

## 2020-12-27 NOTE — Progress Notes (Signed)
I have seen and assessed patient and I agree with Dr.Chotiner's assessment and plan. Patient is a pleasant 35 year old female status post C-section 12/15/2021 admitted for COVID-pneumonia. Patient was placed on IV remdesivir. Patient with worsening hypoxia currently on 8 L high flow nasal cannula with sats of 92%. CRP currently at 9.7 was 17.9 on admission. D-dimer elevated at 3.13. Blood cultures pending. CT angiogram chest negative for PE, bilateral pleural effusions layering dependently, extensive consolidation throughout both lower lobes and right middle lobe. More patchy infiltrate within the upper lobes. Findings consistent with widespread pneumonia. Will saline lock IV fluids. Due to worsening hypoxia, extensive infiltrates noted on CT angiogram chest will place patient on Actemra, Lovenox 40 mg subcu every 12 hours, IV Solu-Medrol 60 mg every 12 hours Claritin. DC albuterol and placed on Combivent inhaler. Patient with history of gestational diabetes, check hemoglobin A1c. Patient on IV steroids. Place on Tradjenta, continue to hold oral hypoglycemic agents, check CBGs before meals and at bedtime. Spoke with OB/GYN as patient with recent C-section with wound VAC to assess incision site. Wound VAC removed by wound care nurse.  No charge.

## 2020-12-27 NOTE — Plan of Care (Signed)
  Problem: Education: Goal: Knowledge of General Education information will improve Description: Including pain rating scale, medication(s)/side effects and non-pharmacologic comfort measures Outcome: Progressing   Problem: Health Behavior/Discharge Planning: Goal: Ability to manage health-related needs will improve Outcome: Progressing   Problem: Clinical Measurements: Goal: Will remain free from infection Outcome: Progressing Goal: Diagnostic test results will improve Outcome: Progressing Goal: Respiratory complications will improve Outcome: Progressing Goal: Cardiovascular complication will be avoided Outcome: Progressing   Problem: Activity: Goal: Risk for activity intolerance will decrease Outcome: Progressing   Problem: Skin Integrity: Goal: Risk for impaired skin integrity will decrease Outcome: Progressing   Problem: Education: Goal: Knowledge of risk factors and measures for prevention of condition will improve Outcome: Progressing   Problem: Coping: Goal: Psychosocial and spiritual needs will be supported Outcome: Progressing   Problem: Respiratory: Goal: Will maintain a patent airway Outcome: Progressing Goal: Complications related to the disease process, condition or treatment will be avoided or minimized Outcome: Progressing

## 2020-12-27 NOTE — H&P (Signed)
History and Physical    MISCHELLE REEG WEX:[id number] DOB: 02-19-1986 DOA: 12/25/2020  PCP: Lennie Odor, PA   Patient coming from:  [facility] ER  Chief Complaint: SOB, cough, fever  HPI: Kathryn Buck is a 35 y.o. female with medical history significant for hypertension, gestational diabetes, postpartum 10 days presented to [facility] ER with fever, cough, shortness of breath that was severe worsening over a 4-day period.  Patient reports she delivered her son on December 15, 2020 at [facility].  She was discharged on December 17, 2020.  Annually third 2022 she developed fever cough and tested positive for COVID at home.  She has been isolating at home and using Tylenol as needed for fever but symptoms progressively worsened she became more short of breath prompting her to go to the emergency room.  Chest x-ray consistent with COVID-pneumonia and repeat COVID test was positive.  CT angiography of the chest was obtained to the emergency room which did not show a pulmonary embolism.  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  She has a wound VAC on the cesarean incision.  Seen in the emergency room at [facility] on January 9 and was there until the night of January 10 awaiting for bed at [facility].  She reports she is feeling better at this time but is still requiring oxygen.  She is able to speak in full sentences.  CRP was elevated.  She has been on remdesivir since the night of January 9.  Review of Systems:  General: Denies weakness, weight loss, night sweats.  Denies dizziness.  Reports decreased appetite HENT: Denies head trauma, headache, denies change in hearing, tinnitus.  Denies nasal congestion or bleeding.  Denies sore throat, sores in mouth.  Denies difficulty swallowing Eyes: Denies blurry vision, pain in eye, drainage.  Denies discoloration of eyes. Neck: Denies pain.  Denies swelling.   Denies pain with movement. Cardiovascular: Denies chest pain, palpitations.  Denies edema.  Denies orthopnea Respiratory: Reports shortness of breath, cough.  Denies wheezing.  Denies sputum production Gastrointestinal: Denies abdominal swelling.  Denies nausea, vomiting, diarrhea.  Denies melena.  Denies hematemesis. Musculoskeletal: Denies limitation of movement.  Reports body aches that have now resolved Genitourinary: Denies pelvic pain.  Denies urinary frequency or hesitancy.  Denies dysuria.  Skin: Denies rash.  Denies petechiae, purpura, ecchymosis. Neurological: Denies syncope. Denies seizure activity. Denies weakness or paresthesia. Denies slurred speech, drooping face.  Denies visual change. Psychiatric: Denies depression, anxiety.  Denies hallucinations.  Past Medical History:  Diagnosis Date  . Diabetes mellitus without complication (HCC)    Type 2  . DVT (deep venous thrombosis) ([place])   . Gestational diabetes    metformin  . History of chlamydia   . History of gonorrhea   . Hypertension    labetalol   . Hypothyroidism    synthroid  . STD (sexually transmitted disease)   . Vaginal Pap smear, abnormal     Past Surgical History:  Procedure Laterality Date  . CESAREAN SECTION N/A 12/15/2020   Procedure: CESAREAN SECTION;  Surgeon: Drema Dallas, DO;  Location: [place] [facility];  Service: Obstetrics;  Laterality: N/A;  . EXPLORATORY LAPAROTOMY     left ovarian cystectomy dermoid 2014  . LAPAROTOMY Left 08/10/2013   Procedure: EXPLORATORY LAPAROTOMY; LEFT OVARIAN CYSTECTOMY;  Surgeon: Terrance Mass, MD;  Location: [place] [facility];  Service:  Gynecology;  Laterality: Left;  . OVARIAN CYST SURGERY    . WISDOM TOOTH EXTRACTION      Social History  reports that she has never smoked. She has never used smokeless tobacco. She reports current alcohol use. She reports that she does not use drugs.  No Known Allergies  Family History  Problem Relation Age of Onset  . Diabetes Mother    . Heart disease Mother   . Hypertension Mother   . Hypertension Father   . Diabetes Sister      Prior to Admission medications   Medication Sig Start Date End Date Taking? Authorizing Provider  acetaminophen (TYLENOL) 325 MG tablet Take 2 tablets (650 mg total) by mouth every 4 (four) hours as needed for mild pain (temperature > 101.5.). 12/17/20  Yes [place], Jade, FNP  cetirizine (ZYRTEC) 10 MG tablet Take 10 mg by mouth daily as needed for allergies.   Yes [provider]  enoxaparin (LOVENOX) 40 MG/0.4ML injection Inject 40 mg into the skin daily.   Yes [provider]  iron polysaccharides (NIFEREX) 150 MG capsule Take 1 capsule (150 mg total) by mouth daily. 12/17/20  Yes [place], Jade, FNP  labetalol (NORMODYNE) 200 MG tablet Take 200 mg by mouth 2 (two) times daily.   Yes [provider]  levothyroxine (LEVOTHROID) 25 MCG tablet Take 1 tablet (25 mcg total) by mouth daily before breakfast. 11/10/19  Yes Huel Cote, NP  metFORMIN (GLUCOPHAGE) 500 MG tablet Take 500 mg by mouth 2 (two) times daily with a meal.   Yes [provider]  oxyCODONE (OXY IR/ROXICODONE) 5 MG immediate release tablet Take 1 tablet (5 mg total) by mouth every 4 (four) hours as needed for moderate pain. 12/17/20  Yes [place], Luvenia Starch, FNP  Prenatal Vit-Fe Fumarate-FA (PRENATAL MULTIVITAMIN) TABS tablet Take 1 tablet by mouth daily at 12 noon.   Yes [provider]  senna-docusate (SENOKOT-S) 8.6-50 MG tablet Take 2 tablets by mouth daily. 12/17/20  Yes [place], Jade, FNP  valACYclovir (VALTREX) 500 MG tablet Take 500 mg by mouth 2 (two) times daily.   Yes [provider]    Physical Exam: Vitals:   12/26/20 1900 12/26/20 2042 12/26/20 2230 12/26/20 2358  BP: 131/81  125/73 131/80  Pulse: 73  72 67  Resp: 19  (!) 24 (!) 28  Temp:   99.3 F (37.4 C) 99.7 F (37.6 C)  TempSrc:    Oral  SpO2: 96% 97% 95% 95%  Weight:      Height:        Constitutional: NAD,  calm, comfortable Vitals:   12/26/20 1900 12/26/20 2042 12/26/20 2230 12/26/20 2358  BP: 131/81  125/73 131/80  Pulse: 73  72 67  Resp: 19  (!) 24 (!) 28  Temp:   99.3 F (37.4 C) 99.7 F (37.6 C)  TempSrc:    Oral  SpO2: 96% 97% 95% 95%  Weight:      Height:       General: WDWN, Alert and oriented x3.  Eyes: EOMI, PERRL, conjunctivae normal.  Sclera nonicteric HENT:  [place]/AT, external ears normal.  Nares patent without epistasis.  Mucous membranes are moist.   Neck: Soft, normal range of motion, supple, no masses, no thyromegaly. Trachea midline Respiratory: clear to auscultation bilaterally, no wheezing, no crackles. Normal respiratory effort. No accessory muscle use.  Cardiovascular: Regular rate and rhythm, no murmurs / rubs / gallops. No extremity edema.  Abdomen: Soft, no tenderness, nondistended, no rebound  or guarding. Obese. No masses palpated. Bowel sounds normoactive. Wound vac in place on lower abdomen Musculoskeletal: FROM. no cyanosis.  Normal muscle tone.  Skin: Warm, dry, intact no rashes, lesions, ulcers. No induration Neurologic: Normal speech.  Sensation intact, Strength 5/5 in all extremities.   Psychiatric: Normal judgment and insight.  Normal mood.    Labs on Admission: I have personally reviewed following labs and imaging studies  CBC: Recent Labs  Lab 12/25/20 1512 12/26/20 0517  WBC 9.0 7.4  NEUTROABS  --  6.0  HGB 9.2* 8.9*  HCT 28.4* 27.9*  MCV 85.3 85.1  PLT 444* 932    Basic Metabolic Panel: Recent Labs  Lab 12/25/20 1512 12/26/20 0517  NA 140 139  K 3.3* 4.2  CL 102 105  CO2 27 24  GLUCOSE 87 97  BUN 11 11  CREATININE 0.78 0.68  CALCIUM 7.7* 8.3*  MG 2.0 2.1  PHOS  --  3.4    GFR: Estimated Creatinine Clearance: 109.9 mL/min (by C-G formula based on SCr of 0.68 mg/dL).  Liver Function Tests: Recent Labs  Lab 12/25/20 1512 12/26/20 0517  AST 30 28  ALT 26 26  ALKPHOS 66 62  BILITOT 0.3 0.3  PROT 6.1* 6.1*  ALBUMIN 2.7*  2.6*    Urine analysis:    Component Value Date/Time   COLORURINE YELLOW 11/06/2017 1153   APPEARANCEUR CLEAR 11/06/2017 1153   LABSPEC 1.018 11/06/2017 1153   PHURINE < OR = 5.0 11/06/2017 1153   GLUCOSEU NEGATIVE 11/06/2017 1153   HGBUR NEGATIVE 11/06/2017 1153   BILIRUBINUR NEGATIVE 05/06/2017 1301   KETONESUR NEGATIVE 11/06/2017 1153   PROTEINUR NEGATIVE 11/06/2017 1153   UROBILINOGEN 0.2 09/16/2013 1232   NITRITE NEGATIVE 05/06/2017 1301   LEUKOCYTESUR 1+ (A) 05/06/2017 1301    Radiological Exams on Admission: CT Angio Chest PE W and/or Wo Contrast  Result Date: 12/25/2020 CLINICAL DATA:  Postpartum. Coronavirus infection. Worsening shortness of breath. EXAM: CT ANGIOGRAPHY CHEST WITH CONTRAST TECHNIQUE: Multidetector CT imaging of the chest was performed using the standard protocol during bolus administration of intravenous contrast. Multiplanar CT image reconstructions and MIPs were obtained to evaluate the vascular anatomy. CONTRAST:  35mL OMNIPAQUE IOHEXOL 350 MG/ML SOLN COMPARISON:  Radiography same day FINDINGS: Cardiovascular: The heart is mildly enlarged. Tiny amount of pericardial fluid. No aortic pathology. Pulmonary arterial opacification is good. There are no pulmonary emboli. Mediastinum/Nodes: No mass or lymphadenopathy. Lungs/Pleura: Bilateral pleural effusions layering dependently. Extensive consolidation throughout both lower lobes. More patchy infiltrate within the upper lobes. Moderate involvement of the right middle lobe. Upper Abdomen: Negative Musculoskeletal: Normal Review of the MIP images confirms the above findings. IMPRESSION: 1. No pulmonary emboli. 2. Bilateral pleural effusions layering dependently. Extensive consolidation throughout both lower lobes and the right middle lobe. More patchy infiltrate within the upper lobes. Findings consistent with widespread pneumonia. Electronically Signed   By: Nelson Chimes M.D.   On: 12/25/2020 17:29   DG Chest Port 1  View  Result Date: 12/25/2020 CLINICAL DATA:  Shortness of breath for 3 days. COVID positive. Recent Caesarean section. EXAM: PORTABLE CHEST 1 VIEW COMPARISON:  None. FINDINGS: Low lung volumes. Upper normal heart size. Hazy bilateral lung base opacities likely combination pleural effusions and airspace disease. Additional patchy opacity in the perihilar region and left mid lung. Mild diffuse peribronchial thickening. No pneumothorax. No acute osseous abnormalities are seen. IMPRESSION: 1. Low lung volumes with borderline cardiomegaly and hazy bilateral lung base opacities likely combination pleural effusions and airspace  disease. 2. Patchy perihilar and left midlung opacities, edema versus multifocal infection. Electronically Signed   By: Keith Rake M.D.   On: 12/25/2020 16:15    EKG: Independently reviewed.  KG shows normal sinus rhythm with no acute ST elevation or depression.  QTC is 435  Assessment/Plan Principal Problem:   Pneumonia due to COVID-19 virus Sarted on remdesivir Supplemental oxygen provided keep O2 sat between 92 to 96% Albuterol MDI every 4 hours as needed for wheezing cough, shortness of breath Antitussives provided Incentive spirometer every 1-2 hours while awake Discussed with patient possibility of needing to add Actemra or baricitinib to his regimen if respiratory status deteriorates.  Discussed pros and cons of therapy.  Patient has no history of TB, immunosuppression, cancer, diverticulitis, hepatitis.  She is in agreement to have this therapy if it is required  Active Problems:   Hypoxia Supplemental oxygen as above. Albuterol MDI as needed    Hypertension Continue Home dose of labetalol.  Monitor blood pressure    Hypothyroidism Continue levothyroxine    Obesity Follow-up with PCP for dietary, lifestyle, possible pharmacal therapy for weight loss    Personal history of thromboembolic disease Is on Lovenox for DVT prophylaxis.  She was on Lovenox prior  to coming to the emergency room    Gestational diabetes mellitus Metformin is held for 48 hours after receiving CT angiography of chest. Blood sugar will be monitored with meals and at bedtime.  Sliding scale insulin provided as needed for glycemic control    DVT prophylaxis: Is on Lovenox for DVT prophylaxis as has elevated Padua score and history of DVT Code Status:   Full code  Family Communication:  Diagnosis and plan discussed with patient.  Patient verbalized understanding agrees with plan.  Further recommendations to follow as clinical indicated Disposition Plan:   Patient is from:  Home  Anticipated DC to:  Home  Anticipated DC date:  Dissipate greater than 2 midnight stay in the hospital to treat acute      condition  Anticipated DC barriers: No barriers to discharge identified at this time  Admission status:  Inpatient   Yevonne Aline Ezana Hubbert MD [facility]  How to contact the [facility] Attending or Consulting provider [place] or covering provider during after hours [place], for this patient?   1. Check the care team in [facility] and look for a) attending/consulting [facility] provider listed and b) the [facility] team listed 2. Log into [email or web address] and use [place]'s universal password to access. If you do not have the password, please contact the hospital operator. 3. Locate the [facility] provider you are looking for under [facility] and page to a number that you can be directly reached. 4. If you still have difficulty reaching the provider, please page the [facility] (Director on Call) for the Hospitalists listed on amion for assistance.  12/27/2020, 2:41 AM

## 2020-12-27 NOTE — Consult Note (Signed)
WOC Nurse Consult Note: Reason for Consult:Dehisced c-section to abdominal pannus.  Has incisional VAC in place for 11 days.  Missed a follow up appointment due to COVID infection.  I am removing the incisional VAC as it has powered down and battery is dead.  Wound type:surgical, infectious Pressure Injury POA: NA Measurement: c section  With raised hypergranulation to left distal pannus. Measures 0.1 cm x 0.5 cm x +0.1 cm  Weeping to abdominal fold.  Will implement interdry AG to wick moisture.  No need for ongoing NPWT Dressing had been bridged to abdominal with blistering present. Will implement silicone foam dressing.  Wound YFV:[id number], moist Drainage (amount, consistency, odor) scant weeping no odor.  Periwound:intact Dressing procedure/placement/frequency:Interdry to abdominal fold:  Measure and cut length of InterDry to fit in skin folds that have skin breakdown Tuck InterDry fabric into skin folds in a single layer, allow for 2 inches of overhang from skin edges to allow for wicking to occur May remove to bathe; dry area thoroughly and then tuck into affected areas again Do not apply any creams or ointments when using InterDry DO NOT THROW AWAY FOR 5 DAYS unless soiled with stool DO NOT [facility] product, this will inactivate the silver in the material  New sheet of Interdry should be applied after 5 days of use if patient continues to have skin breakdown   Silicone foam to blistering left abdomen.  Will not follow at this time.  Please re-consult if needed.  Domenic Moras MSN, RN, FNP-BC CWON Wound, Ostomy, Continence Nurse Pager [phone]

## 2020-12-27 NOTE — Progress Notes (Signed)
Patient is s/p cesarean section on 12/15/2021 currently admitted with Covid Pneumonia.  Negative pressure dressing was removed this morning per patient. She denies abdominal pain.   Vitals:   12/26/20 2358 12/27/20 0347 12/27/20 0804 12/27/20 1222  BP: 131/80 106/64 (!) 129/91 139/85  Pulse: 67 64 62 66  Resp: (!) 26 (!) 27 (!) 22 18  Temp: 99.7 F (37.6 C) 99.3 F (37.4 C) 98.6 F (37 C) 98.5 F (36.9 C)  TempSrc: Oral Oral Oral Oral  SpO2: 95% 92% 92% 96%  Weight:      Height:       General Alert and Oriented.. Sitting in chair  Lungs no distress  Abdomen soft nontender  nondistended  Incision: Wound edges are intact no erythema or exudate. Healing very well  Ext no evidence of edema.   A/P POD #10 s/p cesearean section.  Incision is healing well  Patient to have follow up in office in 4 weeks.   COVID 19 pneumonia. -Managed by hospitalist

## 2020-12-27 NOTE — Progress Notes (Signed)
Inpatient Diabetes Program Recommendations  AACE/ADA: New Consensus Statement on Inpatient Glycemic Control (2015)  Target Ranges:  Prepandial:   less than 140 mg/dL      Peak postprandial:   less than 180 mg/dL (1-2 hours)      Critically ill patients:  140 - 180 mg/dL   Lab Results  Component Value Date   GLUCAP 169 (H) 12/27/2020   HGBA1C 6.0 (H) 11/10/2019    Review of Glycemic Control  Diabetes history: GDM Outpatient Diabetes medications: metformin 500 mg bid Current orders for Inpatient glycemic control: Novolog 0-20 units tidwc and 0-5 units QHS, tradjenta 5 mg QD  On Solumedrol 60 mg Q12H  HgbA1C - pending. Last one 11/10/19 - 6.0% CBGs 110, 169 mg/dL  Inpatient Diabetes Program Recommendations:     Agree with orders.  Continue to follow.  Thank you. Lorenda Peck, RD, LDN, CDE Inpatient Diabetes Coordinator [phone]

## 2020-12-27 NOTE — Progress Notes (Incomplete)
PROGRESS NOTE    Kathryn Buck  [id number] DOB: 02/09/1986 DOA: 12/25/2020 PCP: Lennie Odor, PA (Confirm with patient/family/NH records and if not entered, this HAS to be entered at [facility] point of entry. "No PCP" if truly none.)   Chief Complaint  Patient presents with  . Shortness of Breath    COVID +    Brief Narrative: (Start on day 1 of progress note - keep it brief and live) ***   Assessment & Plan:   Principal Problem:   Pneumonia due to COVID-19 virus Active Problems:   Obesity   Hypertension   Hypothyroidism   Hypoxia   Personal history of thromboembolic disease   Gestational diabetes mellitus   ***   DVT prophylaxis: (Lovenox/Heparin/SCD's/anticoagulated/None (if comfort care) Code Status: (Full/Partial - specify details) Family Communication: (Specify name, relationship & date discussed. NO "discussed with patient") Disposition:   Status is: Inpatient  {Inpatient:23812}  Dispo: The patient is from: {From:23814}              Anticipated d/c is to: {To:23815}              Anticipated d/c date is: {Days:23816}              Patient currently {Medically stable:23817}       Consultants:   ***  Procedures: (Don't include imaging studies which can be auto populated. Include things that cannot be auto populated i.e. Echo, Carotid and venous dopplers, Foley, Bipap, HD, tubes/drains, wound vac, central lines etc)  ***  Antimicrobials: (specify start and planned stop date. Auto populated tables are space occupying and do not give end dates)  ***    Subjective: ***  Objective: Vitals:   12/26/20 2230 12/26/20 2358 12/27/20 0347 12/27/20 0804  BP: 125/73 131/80 106/64 (!) 129/91  Pulse: 72 67 64 62  Resp: (!) 24 (!) 26 (!) 27 (!) 22  Temp: 99.3 F (37.4 C) 99.7 F (37.6 C) 99.3 F (37.4 C) 98.6 F (37 C)  TempSrc:  Oral Oral Oral  SpO2: 95% 95% 92% 92%  Weight:      Height:        Intake/Output Summary (Last 24 hours) at 12/27/2020  1012 Last data filed at 12/27/2020 0753 Gross per 24 hour  Intake 562.2 ml  Output -  Net 562.2 ml   Filed Weights   12/25/20 1455  Weight: 102.1 kg    Examination:  General exam: Appears calm and comfortable  Respiratory system: Clear to auscultation. Respiratory effort normal. Cardiovascular system: S1 & S2 heard, RRR. No JVD, murmurs, rubs, gallops or clicks. No pedal edema. Gastrointestinal system: Abdomen is nondistended, soft and nontender. No organomegaly or masses felt. Normal bowel sounds heard. Central nervous system: Alert and oriented. No focal neurological deficits. Extremities: Symmetric 5 x 5 power. Skin: No rashes, lesions or ulcers Psychiatry: Judgement and insight appear normal. Mood & affect appropriate.     Data Reviewed: I have personally reviewed following labs and imaging studies  CBC: Recent Labs  Lab 12/25/20 1512 12/26/20 0517 12/27/20 0438  WBC 9.0 7.4 9.3  NEUTROABS  --  6.0 7.9*  HGB 9.2* 8.9* 9.2*  HCT 28.4* 27.9* 29.1*  MCV 85.3 85.1 86.6  PLT 444* 264 464*    Basic Metabolic Panel: Recent Labs  Lab 12/25/20 1512 12/26/20 0517 12/27/20 0438  NA 140 139 140  K 3.3* 4.2 3.7  CL 102 105 106  CO2 27 24 25   GLUCOSE 87 97 156*  BUN  11 11 16   CREATININE 0.78 0.68 0.69  0.79  CALCIUM 7.7* 8.3* 8.3*  MG 2.0 2.1 2.0  PHOS  --  3.4 3.7    GFR: Estimated Creatinine Clearance: 109.9 mL/min (by C-G formula based on SCr of 0.79 mg/dL).  Liver Function Tests: Recent Labs  Lab 12/25/20 1512 12/26/20 0517 12/27/20 0438  AST 30 28 32  ALT 26 26 33  ALKPHOS 66 62 57  BILITOT 0.3 0.3 0.3  PROT 6.1* 6.1* 6.0*  ALBUMIN 2.7* 2.6* 2.6*    CBG: Recent Labs  Lab 12/27/20 0912  GLUCAP 110*     Recent Results (from the past 240 hour(s))  Blood Culture (routine x 2)     Status: None (Preliminary result)   Collection Time: 12/25/20  3:20 PM   Specimen: BLOOD  Result Value Ref Range Status   Specimen Description   Final    BLOOD  RIGHT ANTECUBITAL Performed at [facility], [place] [address] [place], [place] [address]    Special Requests   Final    BOTTLES DRAWN AEROBIC AND ANAEROBIC Blood Culture adequate volume Performed at [facility], [address] [place], [place] [address]    Culture   Final    NO GROWTH 2 DAYS Performed at [facility], [place] [address], [place], [place] [address]    Report Status PENDING  Incomplete  Blood Culture (routine x 2)     Status: None (Preliminary result)   Collection Time: 12/25/20  3:45 PM   Specimen: BLOOD LEFT HAND  Result Value Ref Range Status   Specimen Description   Final    BLOOD LEFT HAND Performed at [facility], [address] [place], [place] [address]    Special Requests   Final    BOTTLES DRAWN AEROBIC AND ANAEROBIC Blood Culture adequate volume Performed at [facility], [address] [place], [place] [address]    Culture   Final    NO GROWTH 2 DAYS Performed at [facility], [place] [address], [place], [place] [address]    Report Status PENDING  Incomplete  Resp Panel by RT-PCR (Flu A&B, Covid) Peripheral     Status: Abnormal   Collection Time: 12/25/20  3:58 PM   Specimen: Peripheral; Nasopharyngeal(NP) swabs in vial transport medium  Result Value Ref Range Status   SARS Coronavirus 2 by RT PCR POSITIVE (A) NEGATIVE Final    Comment: RESULT CALLED TO, READ BACK BY AND VERIFIED WITH: REED C RN [phone] [id number] PHILLIPS C (NOTE) SARS-CoV-2 target nucleic acids are DETECTED.  The SARS-CoV-2 RNA is generally detectable in upper respiratory specimens during the acute phase of infection. Positive results are indicative of the presence of the identified virus, but do not rule out bacterial infection or co-infection with other pathogens not detected by the test. Clinical correlation with patient history and other diagnostic information is necessary to determine patient infection status. The expected result  is Negative.  Fact Sheet for Patients: EntrepreneurPulse.com.au  Fact Sheet for Healthcare Providers: IncredibleEmployment.be  This test is not yet approved or cleared by the [place] FDA and  has been authorized for detection and/or diagnosis of SARS-CoV-2 by FDA under an Emergency Use Authorization (EUA).  This EUA will remain in effect (meaning this test can be  used) for the duration of  the COVID-19 declaration under Section 564(b)(1) of the Act, 21 U.S.C. section 360bbb-3(b)(1), unless the authorization is terminated or  revoked sooner.     Influenza A by PCR NEGATIVE NEGATIVE Final   Influenza B by PCR NEGATIVE NEGATIVE Final    Comment: (NOTE) The Xpert Xpress SARS-CoV-2/FLU/RSV plus assay is intended as an aid in the diagnosis of influenza from Nasopharyngeal swab specimens and should not be used as a sole basis for treatment. Nasal washings and aspirates are unacceptable for Xpert Xpress SARS-CoV-2/FLU/RSV testing.  Fact Sheet for Patients: BloggerCourse.com  Fact Sheet for Healthcare Providers: SeriousBroker.it  This test is not yet approved or cleared by the [place] FDA and has been authorized for detection and/or diagnosis of SARS-CoV-2 by FDA under an Emergency Use Authorization (EUA). This EUA will remain in effect (meaning this test can be used) for the duration of the COVID-19 declaration under Section 564(b)(1) of the Act, 21 U.S.C. section 360bbb-3(b)(1), unless the authorization is terminated or revoked.  Performed at [facility], [address], [place], [place] [address]          Radiology Studies: CT Angio Chest PE W and/or Wo Contrast  Result Date: 12/25/2020 CLINICAL DATA:  Postpartum. Coronavirus infection. Worsening shortness of breath. EXAM: CT ANGIOGRAPHY CHEST WITH CONTRAST TECHNIQUE: Multidetector CT imaging of the chest was  performed using the standard protocol during bolus administration of intravenous contrast. Multiplanar CT image reconstructions and MIPs were obtained to evaluate the vascular anatomy. CONTRAST:  49mL OMNIPAQUE IOHEXOL 350 MG/ML SOLN COMPARISON:  Radiography same day FINDINGS: Cardiovascular: The heart is mildly enlarged. Tiny amount of pericardial fluid. No aortic pathology. Pulmonary arterial opacification is good. There are no pulmonary emboli. Mediastinum/Nodes: No mass or lymphadenopathy. Lungs/Pleura: Bilateral pleural effusions layering dependently. Extensive consolidation throughout both lower lobes. More patchy infiltrate within the upper lobes. Moderate involvement of the right middle lobe. Upper Abdomen: Negative Musculoskeletal: Normal Review of the MIP images confirms the above findings. IMPRESSION: 1. No pulmonary emboli. 2. Bilateral pleural effusions layering dependently. Extensive consolidation throughout both lower lobes and the right middle lobe. More patchy infiltrate within the upper lobes. Findings consistent with widespread pneumonia. Electronically Signed   By: Paulina Fusi M.D.   On: 12/25/2020 17:29   DG Chest Port 1 View  Result Date: 12/25/2020 CLINICAL DATA:  Shortness of breath for 3 days. COVID positive. Recent Caesarean section. EXAM: PORTABLE CHEST 1 VIEW COMPARISON:  None. FINDINGS: Low lung volumes. Upper normal heart size. Hazy bilateral lung base opacities likely combination pleural effusions and airspace disease. Additional patchy opacity in the perihilar region and left mid lung. Mild diffuse peribronchial thickening. No pneumothorax. No acute osseous abnormalities are seen. IMPRESSION: 1. Low lung volumes with borderline cardiomegaly and hazy bilateral lung base opacities likely combination pleural effusions and airspace disease. 2. Patchy perihilar and left midlung opacities, edema versus multifocal infection. Electronically Signed   By: Narda Rutherford M.D.   On:  12/25/2020 16:15        Scheduled Meds: . albuterol  2 puff Inhalation Q6H  . enoxaparin (LOVENOX) injection  40 mg Subcutaneous Q12H  . insulin aspart  0-20 Units Subcutaneous TID WC  . insulin aspart  0-5 Units Subcutaneous QHS  . iron polysaccharides  150 mg Oral Daily  . labetalol  200 mg Oral BID  . levothyroxine  25 mcg Oral Q0600  . linagliptin  5 mg Oral Daily  . loratadine  10 mg Oral Daily  . methylPREDNISolone (SOLU-MEDROL) injection  60 mg Intravenous Q12H  . prenatal multivitamin  1 tablet Oral Q1200  . senna-docusate  2 tablet Oral Daily  . valACYclovir  500 mg Oral BID   Continuous Infusions: . sodium chloride Stopped (12/26/20 1902)  . remdesivir 100 mg in NS 100 mL Stopped (12/26/20 1135)     LOS: 1 day    Time spent: ***    Irine Seal, MD [facility]   To contact the attending provider between 7A-7P or the covering provider during after hours 7P-7A, please log into the web site [email or web address] and access using universal [place] password for that web site. If you do not have the password, please call the hospital operator.  12/27/2020, 10:12 AM

## 2020-12-27 NOTE — Progress Notes (Signed)
   12/26/20 2358  Assess: MEWS Score  Temp 99.7 F (37.6 C)  BP 131/80  Pulse Rate 67  Resp (!) 26  Level of Consciousness Alert  SpO2 95 %  O2 Device Nasal Cannula  Patient Activity (if Appropriate) In bed  O2 Flow Rate (L/min) 8 L/min  Assess: MEWS Score  MEWS Temp 0  MEWS Systolic 0  MEWS Pulse 0  MEWS RR 2  MEWS LOC 0  MEWS Score 2  MEWS Score Color Yellow  Assess: if the MEWS score is Yellow or Red  Were vital signs taken at a resting state? Yes  Focused Assessment No change from prior assessment  Early Detection of Sepsis Score *See Row Information* Low  MEWS guidelines implemented *See Row Information* Yes  Treat  MEWS Interventions Escalated (See documentation below)  Take Vital Signs  Increase Vital Sign Frequency  Yellow: Q 2hr X 2 then Q 4hr X 2, if remains yellow, continue Q 4hrs  Escalate  MEWS: Escalate Yellow: discuss with charge nurse/RN and consider discussing with provider and RRT  Notify: Charge Nurse/RN  Name of Charge Nurse/RN Notified [email or web address]  Date Charge Nurse/RN Notified 12/27/20  Time Charge Nurse/RN Notified 0300

## 2020-12-28 ENCOUNTER — Inpatient Hospital Stay (HOSPITAL_COMMUNITY): Payer: Managed Care, Other (non HMO)

## 2020-12-28 DIAGNOSIS — R7989 Other specified abnormal findings of blood chemistry: Secondary | ICD-10-CM

## 2020-12-28 DIAGNOSIS — U071 COVID-19: Secondary | ICD-10-CM | POA: Diagnosis not present

## 2020-12-28 DIAGNOSIS — J1282 Pneumonia due to coronavirus disease 2019: Secondary | ICD-10-CM | POA: Diagnosis not present

## 2020-12-28 LAB — FERRITIN: Ferritin: 42 ng/mL (ref 11–307)

## 2020-12-28 LAB — CBC WITH DIFFERENTIAL/PLATELET
Abs Immature Granulocytes: 0.21 10*3/uL — ABNORMAL HIGH (ref 0.00–0.07)
Basophils Absolute: 0 10*3/uL (ref 0.0–0.1)
Basophils Relative: 0 %
Eosinophils Absolute: 0 10*3/uL (ref 0.0–0.5)
Eosinophils Relative: 0 %
HCT: 28.2 % — ABNORMAL LOW (ref 36.0–46.0)
Hemoglobin: 8.9 g/dL — ABNORMAL LOW (ref 12.0–15.0)
Immature Granulocytes: 2 %
Lymphocytes Relative: 15 %
Lymphs Abs: 1.6 10*3/uL (ref 0.7–4.0)
MCH: 27.2 pg (ref 26.0–34.0)
MCHC: 31.6 g/dL (ref 30.0–36.0)
MCV: 86.2 fL (ref 80.0–100.0)
Monocytes Absolute: 0.3 10*3/uL (ref 0.1–1.0)
Monocytes Relative: 3 %
Neutro Abs: 9 10*3/uL — ABNORMAL HIGH (ref 1.7–7.7)
Neutrophils Relative %: 80 %
Platelets: 519 10*3/uL — ABNORMAL HIGH (ref 150–400)
RBC: 3.27 MIL/uL — ABNORMAL LOW (ref 3.87–5.11)
RDW: 14.8 % (ref 11.5–15.5)
WBC: 11.2 10*3/uL — ABNORMAL HIGH (ref 4.0–10.5)
nRBC: 0.2 % (ref 0.0–0.2)

## 2020-12-28 LAB — COMPREHENSIVE METABOLIC PANEL
ALT: 43 U/L (ref 0–44)
AST: 37 U/L (ref 15–41)
Albumin: 2.3 g/dL — ABNORMAL LOW (ref 3.5–5.0)
Alkaline Phosphatase: 54 U/L (ref 38–126)
Anion gap: 11 (ref 5–15)
BUN: 17 mg/dL (ref 6–20)
CO2: 24 mmol/L (ref 22–32)
Calcium: 8.2 mg/dL — ABNORMAL LOW (ref 8.9–10.3)
Chloride: 104 mmol/L (ref 98–111)
Creatinine, Ser: 0.85 mg/dL (ref 0.44–1.00)
GFR, Estimated: >60 mL/min (ref >60)
Glucose, Bld: 168 mg/dL — ABNORMAL HIGH (ref 70–99)
Potassium: 3.7 mmol/L (ref 3.5–5.1)
Sodium: 139 mmol/L (ref 135–145)
Total Bilirubin: 0.1 mg/dL — ABNORMAL LOW (ref 0.3–1.2)
Total Protein: 5.4 g/dL — ABNORMAL LOW (ref 6.5–8.1)

## 2020-12-28 LAB — PHOSPHORUS: Phosphorus: 4 mg/dL (ref 2.5–4.6)

## 2020-12-28 LAB — GLUCOSE, CAPILLARY
Glucose-Capillary: 125 mg/dL — ABNORMAL HIGH (ref 70–99)
Glucose-Capillary: 152 mg/dL — ABNORMAL HIGH (ref 70–99)
Glucose-Capillary: 93 mg/dL (ref 70–99)
Glucose-Capillary: 147 mg/dL — ABNORMAL HIGH (ref 70–99)

## 2020-12-28 LAB — HEMOGLOBIN A1C
Hgb A1c MFr Bld: 5.4 % (ref 4.8–5.6)
Mean Plasma Glucose: 108.28 mg/dL

## 2020-12-28 LAB — D-DIMER, QUANTITATIVE: D-Dimer, Quant: 2.69 ug/mL-FEU — ABNORMAL HIGH (ref 0.00–0.50)

## 2020-12-28 LAB — BRAIN NATRIURETIC PEPTIDE: B Natriuretic Peptide: 281.4 pg/mL — ABNORMAL HIGH (ref 0.0–100.0)

## 2020-12-28 LAB — MAGNESIUM: Magnesium: 2.1 mg/dL (ref 1.7–2.4)

## 2020-12-28 LAB — C-REACTIVE PROTEIN: CRP: 4.7 mg/dL — ABNORMAL HIGH (ref <1.0)

## 2020-12-28 MED ORDER — POLYVINYL ALCOHOL 1.4 % OP SOLN
1.0000 [drp] | OPHTHALMIC | Status: DC | PRN
  Administered 2020-12-28: 1 [drp] via OPHTHALMIC
  Filled 2020-12-28: qty 15

## 2020-12-28 NOTE — Plan of Care (Signed)
  Problem: Education: Goal: Knowledge of General Education information will improve Description: Including pain rating scale, medication(s)/side effects and non-pharmacologic comfort measures Outcome: Progressing   Problem: Health Behavior/Discharge Planning: Goal: Ability to manage health-related needs will improve Outcome: Progressing   Problem: Clinical Measurements: Goal: Diagnostic test results will improve Outcome: Progressing   

## 2020-12-28 NOTE — Progress Notes (Signed)
PROGRESS NOTE    Kathryn Buck  [id number] DOB: 02/23/1986 DOA: 12/25/2020 PCP: Lennie Odor, PA   Chief Complaint  Patient presents with  . Shortness of Breath    COVID +   Brief Narrative:  Kathryn Buck is Kathryn Buck 35 y.o. female with medical history significant for hypertension, gestational diabetes, postpartum 10 days presented to [facility] ER with fever, cough, shortness of breath that was severe worsening over Kathryn Buck 4-day period.  Patient reports she delivered her son on December 15, 2020 at [facility].  She was discharged on December 17, 2020.  January third 2022 she developed fever cough and tested positive for COVID at home.  She has been isolating at home and using Tylenol as needed for fever but symptoms progressively worsened she became more short of breath prompting her to go to the emergency room.  Chest x-ray consistent with COVID-pneumonia and repeat COVID test was positive.  CT angiography of the chest was obtained to the emergency room which did not show Kathryn Buck pulmonary embolism.  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  She has Kathryn Buck wound VAC on the cesarean incision.  Seen in the emergency room at [facility] on January 9 and was there until the night of January 10 awaiting for bed at [facility].  She reports she is feeling better at this time but is still requiring oxygen.  She is able to speak in full sentences.  CRP was elevated.  She has been on remdesivir since the night of January 9.  Assessment & Plan:   Principal Problem:   Pneumonia due to COVID-19 virus Active Problems:   Obesity   Hypertension   Hypothyroidism   Hypoxia   Personal history of thromboembolic disease   Gestational diabetes mellitus  Acute Hypoxic Respiratory Failure 2/2 COVID 19 Pneumonia Currently requiring 4 L [place] CT PE 1/9 without PE, bilateral pleural effusions, extensive consolidation throughout both  lower lobes and R midle lobe.  More patchy infiltrate within the upper lobes.  C/w widespread pneumonia. Follow BNP S/p actemra.  Continue steroids, remdesivir.   Strict I/O, daily weights Prone as able, OOB, IS, flutter  COVID-19 Labs  Recent Labs    12/25/20 1513 12/25/20 1558 12/26/20 0517 12/27/20 0438 12/28/20 0446  DDIMER  --    < > 3.30* 3.13* 2.69*  FERRITIN  --   --  62 51 42  LDH 340*  --   --   --   --   CRP  --   --  17.9* 9.7* 4.7*   < > = values in this interval not displayed.    Lab Results  Component Value Date   SARSCOV2NAA POSITIVE (Kathryn Buck) 12/25/2020   [place] NEGATIVE 12/12/2020   [place] Not Detected 04/25/2020   Elevated D dimer Negative LE [place] CT PE protocol without PE  S/p Cesarean Section Well healing incision Follow outpatient with OB gyn  Hypothyroidism Continue synthroid  Hypertension Continue labetalol  Hx DVT Continue lovenox  Gestational Diabetes Continue SSI Hold metformin  DVT prophylaxis: lovenox Code Status: full  Family Communication: none at bedside, declines me calling Disposition:   Status is: Inpatient  Remains inpatient appropriate because:Inpatient level of care appropriate due to severity of illness   Dispo: The patient is from: Home              Anticipated d/c is  to: Home              Anticipated d/c date is: > 3 days              Patient currently is not medically stable to d/c.       Consultants:   obgyn  Procedures:  LE [place] Summary:  RIGHT:  - There is no evidence of deep vein thrombosis in the lower extremity.  However, portions of this examination were limited- see technologist  comments above.    - No cystic structure found in the popliteal fossa.    LEFT:  - There is no evidence of deep vein thrombosis in the lower extremity.  However, portions of this examination were limited- see technologist  comments above.    - No cystic structure found in the popliteal fossa.    Antimicrobials:  Anti-infectives (From admission, onward)   Start     Dose/Rate Route Frequency Ordered Stop   12/27/20 1000  valACYclovir (VALTREX) tablet 500 mg        500 mg Oral 2 times daily 12/27/20 0859     12/26/20 1000  remdesivir 100 mg in sodium chloride 0.9 % 100 mL IVPB  Status:  Discontinued       "Followed by" Linked Group Details   100 mg 200 mL/hr over 30 Minutes Intravenous Daily 12/25/20 1514 12/25/20 1537   12/26/20 1000  remdesivir 100 mg in sodium chloride 0.9 % 100 mL IVPB  Status:  Discontinued        100 mg 200 mL/hr over 30 Minutes Intravenous Daily 12/25/20 1537 12/25/20 1542   12/26/20 1000  remdesivir 100 mg in sodium chloride 0.9 % 100 mL IVPB  Status:  Discontinued       "Followed by" Linked Group Details   100 mg 200 mL/hr over 30 Minutes Intravenous Daily 12/25/20 1542 12/26/20 0833   12/26/20 1000  remdesivir 100 mg in sodium chloride 0.9 % 100 mL IVPB        100 mg 200 mL/hr over 30 Minutes Intravenous Daily 12/26/20 0834 12/30/20 0959   12/25/20 1545  remdesivir 100 mg in sodium chloride 0.9 % 100 mL IVPB  Status:  Discontinued        100 mg 200 mL/hr over 30 Minutes Intravenous Every 30 min 12/25/20 1537 12/25/20 1542   12/25/20 1545  remdesivir 200 mg in sodium chloride 0.9% 250 mL IVPB  Status:  Discontinued       "Followed by" Linked Group Details   200 mg 580 mL/hr over 30 Minutes Intravenous Once 12/25/20 1542 12/26/20 0833   12/25/20 1534  remdesivir 100 mg injection       Note to Pharmacy: Darcel Buck   : cabinet override      12/25/20 1534 12/26/20 0344   12/25/20 1515  remdesivir 200 mg in sodium chloride 0.9% 250 mL IVPB  Status:  Discontinued       "Followed by" Linked Group Details   200 mg 580 mL/hr over 30 Minutes Intravenous Once 12/25/20 1514 12/25/20 1703        Subjective: Feeling better, no new complaints  Objective: Vitals:   12/27/20 2057 12/28/20 0457 12/28/20 1323 12/28/20 1432  BP:  127/74 134/84   Pulse:   69 62   Resp:  18    Temp:  98.8 F (37.1 C) 98.3 F (36.8 C) 98.7 F (37.1 C)  TempSrc:  Oral Oral Oral  SpO2:  92% 92%   Weight: 102  kg     Height: 5\' 2"  (1.575 m)       Intake/Output Summary (Last 24 hours) at 12/28/2020 1435 Last data filed at 12/28/2020 1300 Gross per 24 hour  Intake 480 ml  Output --  Net 480 ml   Filed Weights   12/25/20 1455 12/27/20 2057  Weight: 102.1 kg 102 kg    Examination:  General exam: Appears calm and comfortable  Respiratory system: Clear to auscultation. Respiratory effort normal. Cardiovascular system: S1 & S2 heard, RRR.  Gastrointestinal system: Abdomen is nondistended, soft and nontender.  Central nervous system: Alert and oriented. No focal neurological deficits. Extremities: trace LEE Skin: well healing c section incision Psychiatry: Judgement and insight appear normal. Mood & affect appropriate.     Data Reviewed: I have personally reviewed following labs and imaging studies  CBC: Recent Labs  Lab 12/25/20 1512 12/26/20 0517 12/27/20 0438 12/28/20 0446  WBC 9.0 7.4 9.3 11.2*  NEUTROABS  --  6.0 7.9* 9.0*  HGB 9.2* 8.9* 9.2* 8.9*  HCT 28.4* 27.9* 29.1* 28.2*  MCV 85.3 85.1 86.6 86.2  PLT 444* 264 464* 519*    Basic Metabolic Panel: Recent Labs  Lab 12/25/20 1512 12/26/20 0517 12/27/20 0438 12/28/20 0446  NA 140 139 140 139  K 3.3* 4.2 3.7 3.7  CL 102 105 106 104  CO2 27 24 25 24   GLUCOSE 87 97 156* 168*  BUN 11 11 16 17   CREATININE 0.78 0.68 0.69  0.79 0.85  CALCIUM 7.7* 8.3* 8.3* 8.2*  MG 2.0 2.1 2.0 2.1  PHOS  --  3.4 3.7 4.0    GFR: Estimated Creatinine Clearance: 103.4 mL/min (by C-G formula based on SCr of 0.85 mg/dL).  Liver Function Tests: Recent Labs  Lab 12/25/20 1512 12/26/20 0517 12/27/20 0438 12/28/20 0446  AST 30 28 32 37  ALT 26 26 33 43  ALKPHOS 66 62 57 54  BILITOT 0.3 0.3 0.3 0.1*  PROT 6.1* 6.1* 6.0* 5.4*  ALBUMIN 2.7* 2.6* 2.6* 2.3*    CBG: Recent Labs  Lab  12/27/20 0912 12/27/20 1217 12/27/20 1731 12/28/20 0957 12/28/20 1146  GLUCAP 110* 169* 117* 125* 152*     Recent Results (from the past 240 hour(s))  Blood Culture (routine x 2)     Status: None (Preliminary result)   Collection Time: 12/25/20  3:20 PM   Specimen: BLOOD  Result Value Ref Range Status   Specimen Description   Final    BLOOD RIGHT ANTECUBITAL Performed at [facility], [place] [address] [place], [place] [address]    Special Requests   Final    BOTTLES DRAWN AEROBIC AND ANAEROBIC Blood Culture adequate volume Performed at [facility], [address] [place], [place] [address]    Culture   Final    NO GROWTH 3 DAYS Performed at [facility], [place] [address] [place], [place] [address]    Report Status PENDING  Incomplete  Blood Culture (routine x 2)     Status: None (Preliminary result)   Collection Time: 12/25/20  3:45 PM   Specimen: BLOOD LEFT HAND  Result Value Ref Range Status   Specimen Description   Final    BLOOD LEFT HAND Performed at [facility], [address] [place], [place] [address]    Special Requests   Final    BOTTLES DRAWN AEROBIC AND ANAEROBIC Blood Culture adequate volume Performed at [facility], [address] [place], [place] [address]    Culture   Final    NO GROWTH 3 DAYS Performed at [facility], [place] [address], [place], [place] [address]    Report Status PENDING  Incomplete  Resp Panel by RT-PCR (Flu Gaylia Kassel&B, Covid) Peripheral     Status: Abnormal   Collection Time: 12/25/20  3:58 PM   Specimen: Peripheral; Nasopharyngeal(NP) swabs in vial transport medium  Result Value Ref Range Status   SARS Coronavirus 2 by RT PCR POSITIVE (Lyda Colcord) NEGATIVE Final    Comment: RESULT CALLED TO, READ BACK BY AND VERIFIED WITH: REED C RN [phone] [id number] PHILLIPS C (NOTE) SARS-CoV-2 target nucleic acids are DETECTED.  The SARS-CoV-2 RNA is generally detectable in upper respiratory specimens during  the acute phase of infection. Positive results are indicative of the presence of the identified virus, but do not rule out bacterial infection or co-infection with other pathogens not detected by the test. Clinical correlation with patient history and other diagnostic information is necessary to determine patient infection status. The expected result is Negative.  Fact Sheet for Patients: EntrepreneurPulse.com.au  Fact Sheet for Healthcare Providers: IncredibleEmployment.be  This test is not yet approved or cleared by the [place] FDA and  has been authorized for detection and/or diagnosis of SARS-CoV-2 by FDA under an Emergency Use Authorization (EUA).  This EUA will remain in effect (meaning this test can be  used) for the duration of  the COVID-19 declaration under Section 564(b)(1) of the Act, 21 U.S.C. section 360bbb-3(b)(1), unless the authorization is terminated or revoked sooner.     Influenza N[place]ia [place] by PCR NEGATIVE NEGATIVE Final   Influenza B by PCR NEGATIVE NEGATIVE Final    Comment: (NOTE) The Xpert Xpress SARS-CoV-2/FLU/RSV plus assay is intended as an aid in the diagnosis of influenza from Nasopharyngeal swab specimens and should not be used as Harvey [place]ingo sole basis for treatment. Nasal washings and aspirates are unacceptable for Xpert Xpress SARS-CoV-2/FLU/RSV testing.  Fact Sheet for Patients: EntrepreneurPulse.com.au  Fact Sheet for Healthcare Providers: IncredibleEmployment.be  This test is not yet approved or cleared by the [place] FDA and has been authorized for detection and/or diagnosis of SARS-CoV-2 by FDA under an Emergency Use Authorization (EUA). This EUA will remain in effect (meaning this test can be used) for the duration of the COVID-19 declaration under Section 564(b)(1) of the Act, 21 U.S.C. section 360bbb-3(b)(1), unless the authorization is terminated  or revoked.  Performed at [facility], [address], [place], [place] [address]          Radiology Studies: VAS [place] LOWER EXTREMITY VENOUS (DVT)  Result Date: 12/28/2020  Lower Venous DVT Study Indications: Elevated Ddimer.  Risk Factors: COVID 19 positive. Limitations: Body habitus and poor ultrasound/tissue interface. Comparison Study: No prior studies. Performing Technologist: Oliver Hum RVT  Examination Guidelines: Eri Mcevers complete evaluation includes B-mode imaging, spectral Doppler, color Doppler, and power Doppler as needed of all accessible portions of each vessel. Bilateral testing is considered an integral part of Ralphie Lovelady complete examination. Limited examinations for reoccurring indications may be performed as noted. The reflux portion of the exam is performed with the patient in reverse Trendelenburg.  +---------+---------------+---------+-----------+----------+--------------+ RIGHT    CompressibilityPhasicitySpontaneityPropertiesThrombus Aging +---------+---------------+---------+-----------+----------+--------------+ CFV      Full           Yes      Yes                                 +---------+---------------+---------+-----------+----------+--------------+  SFJ      Full                                                        +---------+---------------+---------+-----------+----------+--------------+ FV Prox  Full                                                        +---------+---------------+---------+-----------+----------+--------------+ FV Mid   Full                                                        +---------+---------------+---------+-----------+----------+--------------+ FV DistalFull                                                        +---------+---------------+---------+-----------+----------+--------------+ PFV      Full                                                         +---------+---------------+---------+-----------+----------+--------------+ POP      Full           Yes      Yes                                 +---------+---------------+---------+-----------+----------+--------------+ PTV      Full                                                        +---------+---------------+---------+-----------+----------+--------------+ PERO     Full                                                        +---------+---------------+---------+-----------+----------+--------------+   +---------+---------------+---------+-----------+----------+--------------+ LEFT     CompressibilityPhasicitySpontaneityPropertiesThrombus Aging +---------+---------------+---------+-----------+----------+--------------+ CFV      Full           Yes      Yes                                 +---------+---------------+---------+-----------+----------+--------------+ SFJ      Full                                                        +---------+---------------+---------+-----------+----------+--------------+  FV Prox  Full                                                        +---------+---------------+---------+-----------+----------+--------------+ FV Mid   Full                                                        +---------+---------------+---------+-----------+----------+--------------+ FV DistalFull                                                        +---------+---------------+---------+-----------+----------+--------------+ PFV      Full                                                        +---------+---------------+---------+-----------+----------+--------------+ POP      Full           Yes      Yes                                 +---------+---------------+---------+-----------+----------+--------------+ PTV      Full                                                         +---------+---------------+---------+-----------+----------+--------------+ PERO     Full                                                        +---------+---------------+---------+-----------+----------+--------------+     Summary: RIGHT: - There is no evidence of deep vein thrombosis in the lower extremity. However, portions of this examination were limited- see technologist comments above.  - No cystic structure found in the popliteal fossa.  LEFT: - There is no evidence of deep vein thrombosis in the lower extremity. However, portions of this examination were limited- see technologist comments above.  - No cystic structure found in the popliteal fossa.  *See table(s) above for measurements and observations.    Preliminary         Scheduled Meds: . enoxaparin (LOVENOX) injection  40 mg Subcutaneous Q12H  . insulin aspart  0-20 Units Subcutaneous TID WC  . insulin aspart  0-5 Units Subcutaneous QHS  . Ipratropium-Albuterol  2 puff Inhalation Q6H  . iron polysaccharides  150 mg Oral Daily  . labetalol  200 mg Oral BID  . levothyroxine  25 mcg Oral Q0600  . linagliptin  5 mg Oral Daily  . loratadine  10 mg Oral Daily  .  methylPREDNISolone (SOLU-MEDROL) injection  60 mg Intravenous Q12H  . prenatal multivitamin  1 tablet Oral Q1200  . senna-docusate  2 tablet Oral Daily  . valACYclovir  500 mg Oral BID   Continuous Infusions: . sodium chloride Stopped (12/26/20 1902)  . remdesivir 100 mg in NS 100 mL 100 mg (12/28/20 1130)     LOS: 2 days    Time spent: over 38 min    Fayrene Helper, MD [facility]   To contact the attending provider between 7A-7P or the covering provider during after hours 7P-7A, please log into the web site [email or web address] and access using universal [place] password for that web site. If you do not have the password, please call the hospital operator.  12/28/2020, 2:35 PM

## 2020-12-28 NOTE — Progress Notes (Signed)
Bilateral lower extremity venous duplex has been completed. Preliminary results can be found in CV Proc through chart review.   12/28/20 10:00 AM Kathryn Buck RVT

## 2020-12-28 NOTE — Progress Notes (Signed)
Pt on 5l [place] Hi Flo SPO2 97% decreased O2 to 4l regular [place] --on 4 liters [place] 95%.

## 2020-12-28 NOTE — Progress Notes (Signed)
I spoke with pt by phone due to contact precautions. Pt states that she is doing much better today. We talked about the power of the word and suggested a scripture to focus on. The chaplain offered caring and supportive presence, prayers and blessings. Pt was grateful for the call.

## 2020-12-29 ENCOUNTER — Inpatient Hospital Stay (HOSPITAL_COMMUNITY): Payer: Managed Care, Other (non HMO)

## 2020-12-29 DIAGNOSIS — R7989 Other specified abnormal findings of blood chemistry: Secondary | ICD-10-CM

## 2020-12-29 DIAGNOSIS — R0602 Shortness of breath: Secondary | ICD-10-CM

## 2020-12-29 DIAGNOSIS — U071 COVID-19: Secondary | ICD-10-CM | POA: Diagnosis not present

## 2020-12-29 DIAGNOSIS — J1282 Pneumonia due to coronavirus disease 2019: Secondary | ICD-10-CM | POA: Diagnosis not present

## 2020-12-29 LAB — ECHOCARDIOGRAM LIMITED
Calc EF: 55.8 %
Height: 62 in
Single Plane A2C EF: 59.9 %
Single Plane A4C EF: 53.8 %
Weight: 3534.41 oz

## 2020-12-29 LAB — COMPREHENSIVE METABOLIC PANEL
ALT: 41 U/L (ref 0–44)
AST: 24 U/L (ref 15–41)
Albumin: 2.4 g/dL — ABNORMAL LOW (ref 3.5–5.0)
Alkaline Phosphatase: 50 U/L (ref 38–126)
Anion gap: 8 (ref 5–15)
BUN: 19 mg/dL (ref 6–20)
CO2: 27 mmol/L (ref 22–32)
Calcium: 8.1 mg/dL — ABNORMAL LOW (ref 8.9–10.3)
Chloride: 105 mmol/L (ref 98–111)
Creatinine, Ser: 0.85 mg/dL (ref 0.44–1.00)
GFR, Estimated: >60 mL/min (ref >60)
Glucose, Bld: 126 mg/dL — ABNORMAL HIGH (ref 70–99)
Potassium: 3.8 mmol/L (ref 3.5–5.1)
Sodium: 140 mmol/L (ref 135–145)
Total Bilirubin: 0.4 mg/dL (ref 0.3–1.2)
Total Protein: 5.4 g/dL — ABNORMAL LOW (ref 6.5–8.1)

## 2020-12-29 LAB — CBC WITH DIFFERENTIAL/PLATELET
Abs Immature Granulocytes: 0.65 10*3/uL — ABNORMAL HIGH (ref 0.00–0.07)
Basophils Absolute: 0 10*3/uL (ref 0.0–0.1)
Basophils Relative: 0 %
Eosinophils Absolute: 0 10*3/uL (ref 0.0–0.5)
Eosinophils Relative: 0 %
HCT: 30.2 % — ABNORMAL LOW (ref 36.0–46.0)
Hemoglobin: 9.5 g/dL — ABNORMAL LOW (ref 12.0–15.0)
Immature Granulocytes: 5 %
Lymphocytes Relative: 14 %
Lymphs Abs: 1.9 10*3/uL (ref 0.7–4.0)
MCH: 27 pg (ref 26.0–34.0)
MCHC: 31.5 g/dL (ref 30.0–36.0)
MCV: 85.8 fL (ref 80.0–100.0)
Monocytes Absolute: 0.6 10*3/uL (ref 0.1–1.0)
Monocytes Relative: 4 %
Neutro Abs: 10.5 10*3/uL — ABNORMAL HIGH (ref 1.7–7.7)
Neutrophils Relative %: 77 %
Platelets: 626 10*3/uL — ABNORMAL HIGH (ref 150–400)
RBC: 3.52 MIL/uL — ABNORMAL LOW (ref 3.87–5.11)
RDW: 14.8 % (ref 11.5–15.5)
WBC: 13.6 10*3/uL — ABNORMAL HIGH (ref 4.0–10.5)
nRBC: 0.3 % — ABNORMAL HIGH (ref 0.0–0.2)

## 2020-12-29 LAB — GLUCOSE, CAPILLARY
Glucose-Capillary: 187 mg/dL — ABNORMAL HIGH (ref 70–99)
Glucose-Capillary: 102 mg/dL — ABNORMAL HIGH (ref 70–99)
Glucose-Capillary: 153 mg/dL — ABNORMAL HIGH (ref 70–99)

## 2020-12-29 LAB — C-REACTIVE PROTEIN: CRP: 2.5 mg/dL — ABNORMAL HIGH (ref <1.0)

## 2020-12-29 LAB — FERRITIN: Ferritin: 34 ng/mL (ref 11–307)

## 2020-12-29 LAB — D-DIMER, QUANTITATIVE: D-Dimer, Quant: 3.7 ug/mL-FEU — ABNORMAL HIGH (ref 0.00–0.50)

## 2020-12-29 LAB — MAGNESIUM: Magnesium: 2.1 mg/dL (ref 1.7–2.4)

## 2020-12-29 LAB — PHOSPHORUS: Phosphorus: 4.2 mg/dL (ref 2.5–4.6)

## 2020-12-29 MED ORDER — IPRATROPIUM-ALBUTEROL 20-100 MCG/ACT IN AERS
2.0000 | INHALATION_SPRAY | Freq: Two times a day (BID) | RESPIRATORY_TRACT | Status: DC
  Administered 2020-12-29 – 2020-12-30 (×2): 2 via RESPIRATORY_TRACT
  Filled 2020-12-29: qty 4

## 2020-12-29 NOTE — Progress Notes (Signed)
PROGRESS NOTE    Kathryn Buck  [id number] DOB: 08/30/1986 DOA: 12/25/2020 PCP: Lennie Odor, PA   Chief Complaint  Patient presents with  . Shortness of Breath    COVID +   Brief Narrative:  Kathryn Buck is Kathryn Buck 35 y.o. female with medical history significant for hypertension, gestational diabetes, postpartum 10 days presented to [facility] ER with fever, cough, shortness of breath that was severe worsening over Kathryn Buck 4-day period.  Patient reports she delivered her son on December 15, 2020 at [facility].  She was discharged on December 17, 2020.  January third 2022 she developed fever cough and tested positive for COVID at home.  She has been isolating at home and using Tylenol as needed for fever but symptoms progressively worsened she became more short of breath prompting her to go to the emergency room.  Chest x-ray consistent with COVID-pneumonia and repeat COVID test was positive.  CT angiography of the chest was obtained to the emergency room which did not show Ainslie Mazurek pulmonary embolism.  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  She has Kennah Hehr wound VAC on the cesarean incision.  Seen in the emergency room at [facility] on January 9 and was there until the night of January 10 awaiting for bed at [facility].  She reports she is feeling better at this time but is still requiring oxygen.  She is able to speak in full sentences.  CRP was elevated.  She has been on remdesivir since the night of January 9.  Assessment & Plan:   Principal Problem:   Pneumonia due to COVID-19 virus Active Problems:   Obesity   Hypertension   Hypothyroidism   Hypoxia   Personal history of thromboembolic disease   Gestational diabetes mellitus  Acute Hypoxic Respiratory Failure 2/2 COVID 19 Pneumonia Currently requiring 4 L Russell (weaned to 2 L at bedside today) CT PE 1/9 without PE, bilateral pleural effusions,  extensive consolidation throughout both lower lobes and R midle lobe.  More patchy infiltrate within the upper lobes.  C/w widespread pneumonia. CXR 1/14 pending Follow BNP (elevated -> echo with normal EF - see report) S/p actemra.  Continue steroids, remdesivir.   Strict I/O, daily weights Prone as able, OOB, IS, flutter  COVID-19 Labs  Recent Labs    12/27/20 0438 12/28/20 0446 12/29/20 0456  DDIMER 3.13* 2.69* 3.70*  FERRITIN 51 42 34  CRP 9.7* 4.7* 2.5*    Lab Results  Component Value Date   SARSCOV2NAA POSITIVE (Tanis Hensarling) 12/25/2020   [place] NEGATIVE 12/12/2020   [place] Not Detected 04/25/2020   Elevated D dimer Negative LE [place] CT PE protocol without PE D dimer fluctuating, but likely 2/2 inflammation with covid, follow  S/p Cesarean Section Well healing incision Follow outpatient with OB gyn  Hypothyroidism Continue synthroid  Hypertension Continue labetalol  Hx DVT Continue prophylactic lovenox  Gestational Diabetes Continue SSI Hold metformin  DVT prophylaxis: lovenox Code Status: full  Family Communication: none at bedside, declines me calling Disposition:   Status is: Inpatient  Remains inpatient appropriate because:Inpatient level of care appropriate due to severity of illness   Dispo: The patient is from: Home              Anticipated d/c is to: Home              Anticipated d/c date is: >  3 days              Patient currently is not medically stable to d/c.       Consultants:   obgyn  Procedures:  LE [place] Summary:  RIGHT:  - There is no evidence of deep vein thrombosis in the lower extremity.  However, portions of this examination were limited- see technologist  comments above.    - No cystic structure found in the popliteal fossa.    LEFT:  - There is no evidence of deep vein thrombosis in the lower extremity.  However, portions of this examination were limited- see technologist  comments above.    - No cystic  structure found in the popliteal fossa.   Antimicrobials:  Anti-infectives (From admission, onward)   Start     Dose/Rate Route Frequency Ordered Stop   12/27/20 1000  valACYclovir (VALTREX) tablet 500 mg        500 mg Oral 2 times daily 12/27/20 0859     12/26/20 1000  remdesivir 100 mg in sodium chloride 0.9 % 100 mL IVPB  Status:  Discontinued       "Followed by" Linked Group Details   100 mg 200 mL/hr over 30 Minutes Intravenous Daily 12/25/20 1514 12/25/20 1537   12/26/20 1000  remdesivir 100 mg in sodium chloride 0.9 % 100 mL IVPB  Status:  Discontinued        100 mg 200 mL/hr over 30 Minutes Intravenous Daily 12/25/20 1537 12/25/20 1542   12/26/20 1000  remdesivir 100 mg in sodium chloride 0.9 % 100 mL IVPB  Status:  Discontinued       "Followed by" Linked Group Details   100 mg 200 mL/hr over 30 Minutes Intravenous Daily 12/25/20 1542 12/26/20 0833   12/26/20 1000  remdesivir 100 mg in sodium chloride 0.9 % 100 mL IVPB        100 mg 200 mL/hr over 30 Minutes Intravenous Daily 12/26/20 0834 12/29/20 0959   12/25/20 1545  remdesivir 100 mg in sodium chloride 0.9 % 100 mL IVPB  Status:  Discontinued        100 mg 200 mL/hr over 30 Minutes Intravenous Every 30 min 12/25/20 1537 12/25/20 1542   12/25/20 1545  remdesivir 200 mg in sodium chloride 0.9% 250 mL IVPB  Status:  Discontinued       "Followed by" Linked Group Details   200 mg 580 mL/hr over 30 Minutes Intravenous Once 12/25/20 1542 12/26/20 0833   12/25/20 1534  remdesivir 100 mg injection       Note to Pharmacy: [name]   : cabinet override      12/25/20 1534 12/26/20 0344   12/25/20 1515  remdesivir 200 mg in sodium chloride 0.9% 250 mL IVPB  Status:  Discontinued       "Followed by" Linked Group Details   200 mg 580 mL/hr over 30 Minutes Intravenous Once 12/25/20 1514 12/25/20 1703        Subjective: No new complaints Feeling Kathryn Buck little better everyday, still requiring oxygen and SOB with  exertion  Objective: Vitals:   12/28/20 2121 12/29/20 0500 12/29/20 0515 12/29/20 0920  BP: 131/81  128/80 138/84  Pulse: 71  (!) 57 (!) 58  Resp: 18   18  Temp: 98.5 F (36.9 C)  98.4 F (36.9 C) (!) 97.2 F (36.2 C)  TempSrc: Oral  Oral Oral  SpO2: 96%  93% 96%  Weight:  100.2 kg    Height:  Intake/Output Summary (Last 24 hours) at 12/29/2020 1112 Last data filed at 12/29/2020 0315 Gross per 24 hour  Intake 340 ml  Output 900 ml  Net -560 ml   Filed Weights   12/25/20 1455 12/27/20 2057 12/29/20 0500  Weight: 102.1 kg 102 kg 100.2 kg    Examination:  General: No acute distress. Cardiovascular: Heart sounds show Ferry Matthis regular rate, and rhythm. Lungs: Clear to auscultation bilaterally  Abdomen: Soft, nontender, nondistended Neurological: Alert and oriented 3. Moves all extremities 4. Cranial nerves II through XII grossly intact. Skin: Warm and dry. No rashes or lesions. Extremities: No clubbing or cyanosis. No edema.   Data Reviewed: I have personally reviewed following labs and imaging studies  CBC: Recent Labs  Lab 12/25/20 1512 12/26/20 0517 12/27/20 0438 12/28/20 0446 12/29/20 0456  WBC 9.0 7.4 9.3 11.2* 13.6*  NEUTROABS  --  6.0 7.9* 9.0* 10.5*  HGB 9.2* 8.9* 9.2* 8.9* 9.5*  HCT 28.4* 27.9* 29.1* 28.2* 30.2*  MCV 85.3 85.1 86.6 86.2 85.8  PLT 444* 264 464* 519* 626*    Basic Metabolic Panel: Recent Labs  Lab 12/25/20 1512 12/26/20 0517 12/27/20 0438 12/28/20 0446 12/29/20 0456  NA 140 139 140 139 140  K 3.3* 4.2 3.7 3.7 3.8  CL 102 105 106 104 105  CO2 27 24 25 24 27   GLUCOSE 87 97 156* 168* 126*  BUN 11 11 16 17 19   CREATININE 0.78 0.68 0.69  0.79 0.85 0.85  CALCIUM 7.7* 8.3* 8.3* 8.2* 8.1*  MG 2.0 2.1 2.0 2.1 2.1  PHOS  --  3.4 3.7 4.0 4.2    GFR: Estimated Creatinine Clearance: 102.2 mL/min (by C-G formula based on SCr of 0.85 mg/dL).  Liver Function Tests: Recent Labs  Lab 12/25/20 1512 12/26/20 0517 12/27/20 0438  12/28/20 0446 12/29/20 0456  AST 30 28 32 37 24  ALT 26 26 33 43 41  ALKPHOS 66 62 57 54 50  BILITOT 0.3 0.3 0.3 0.1* 0.4  PROT 6.1* 6.1* 6.0* 5.4* 5.4*  ALBUMIN 2.7* 2.6* 2.6* 2.3* 2.4*    CBG: Recent Labs  Lab 12/28/20 0957 12/28/20 1146 12/28/20 1632 12/28/20 2119 12/29/20 0747  GLUCAP 125* 152* 93 147* 187*     Recent Results (from the past 240 hour(s))  Blood Culture (routine x 2)     Status: None (Preliminary result)   Collection Time: 12/25/20  3:20 PM   Specimen: BLOOD  Result Value Ref Range Status   Specimen Description   Final    BLOOD RIGHT ANTECUBITAL Performed at [facility], [place] [address] [place], [place] [address]    Special Requests   Final    BOTTLES DRAWN AEROBIC AND ANAEROBIC Blood Culture adequate volume Performed at [facility], [address] [place], [place] [address]    Culture   Final    NO GROWTH 4 DAYS Performed at [facility], [place] [address] [place], [place] [address]    Report Status PENDING  Incomplete  Blood Culture (routine x 2)     Status: None (Preliminary result)   Collection Time: 12/25/20  3:45 PM   Specimen: BLOOD LEFT HAND  Result Value Ref Range Status   Specimen Description   Final    BLOOD LEFT HAND Performed at [facility], [address] [place], [place] [address]    Special Requests   Final    BOTTLES DRAWN AEROBIC AND ANAEROBIC Blood Culture adequate volume Performed  at [facility] [address] [place], [place] [address]    Culture   Final    NO GROWTH 4 DAYS Performed at [facility], [place] [address], [place], [place] [address]    Report Status PENDING  Incomplete  Resp Panel by RT-PCR (Flu Caralynn Gelber&B, Covid) Peripheral     Status: Abnormal   Collection Time: 12/25/20  3:58 PM   Specimen: Peripheral; Nasopharyngeal(NP) swabs in vial transport medium  Result Value Ref Range Status   SARS Coronavirus 2 by RT PCR POSITIVE (Talor Desrosiers) NEGATIVE Final    Comment:  RESULT CALLED TO, READ BACK BY AND VERIFIED WITH: REED C RN [phone] [id number] PHILLIPS C (NOTE) SARS-CoV-2 target nucleic acids are DETECTED.  The SARS-CoV-2 RNA is generally detectable in upper respiratory specimens during the acute phase of infection. Positive results are indicative of the presence of the identified virus, but do not rule out bacterial infection or co-infection with other pathogens not detected by the test. Clinical correlation with patient history and other diagnostic information is necessary to determine patient infection status. The expected result is Negative.  Fact Sheet for Patients: EntrepreneurPulse.com.au  Fact Sheet for Healthcare Providers: IncredibleEmployment.be  This test is not yet approved or cleared by the [place] FDA and  has been authorized for detection and/or diagnosis of SARS-CoV-2 by FDA under an Emergency Use Authorization (EUA).  This EUA will remain in effect (meaning this test can be  used) for the duration of  the COVID-19 declaration under Section 564(b)(1) of the Act, 21 U.S.C. section 360bbb-3(b)(1), unless the authorization is terminated or revoked sooner.     Influenza Mychal Decarlo by PCR NEGATIVE NEGATIVE Final   Influenza B by PCR NEGATIVE NEGATIVE Final    Comment: (NOTE) The Xpert Xpress SARS-CoV-2/FLU/RSV plus assay is intended as an aid in the diagnosis of influenza from Nasopharyngeal swab specimens and should not be used as Lamarion Mcevers sole basis for treatment. Nasal washings and aspirates are unacceptable for Xpert Xpress SARS-CoV-2/FLU/RSV testing.  Fact Sheet for Patients: EntrepreneurPulse.com.au  Fact Sheet for Healthcare Providers: IncredibleEmployment.be  This test is not yet approved or cleared by the [place] FDA and has been authorized for detection and/or diagnosis of SARS-CoV-2 by FDA under an Emergency Use Authorization (EUA). This EUA will  remain in effect (meaning this test can be used) for the duration of the COVID-19 declaration under Section 564(b)(1) of the Act, 21 U.S.C. section 360bbb-3(b)(1), unless the authorization is terminated or revoked.  Performed at [facility], [address] [place], [place] [address]          Radiology Studies: VAS [place] LOWER EXTREMITY VENOUS (DVT)  Result Date: 12/28/2020  Lower Venous DVT Study Indications: Elevated Ddimer.  Risk Factors: COVID 19 positive. Limitations: Body habitus and poor ultrasound/tissue interface. Comparison Study: No prior studies. Performing Technologist: Oliver Hum RVT  Examination Guidelines: Erol Flanagin complete evaluation includes B-mode imaging, spectral Doppler, color Doppler, and power Doppler as needed of all accessible portions of each vessel. Bilateral testing is considered an integral part of Nhu Glasby complete examination. Limited examinations for reoccurring indications may be performed as noted. The reflux portion of the exam is performed with the patient in reverse Trendelenburg.  +---------+---------------+---------+-----------+----------+--------------+ RIGHT    CompressibilityPhasicitySpontaneityPropertiesThrombus Aging +---------+---------------+---------+-----------+----------+--------------+ CFV      Full           Yes      Yes                                 +---------+---------------+---------+-----------+----------+--------------+  SFJ      Full                                                        +---------+---------------+---------+-----------+----------+--------------+ FV Prox  Full                                                        +---------+---------------+---------+-----------+----------+--------------+ FV Mid   Full                                                        +---------+---------------+---------+-----------+----------+--------------+ FV DistalFull                                                         +---------+---------------+---------+-----------+----------+--------------+ PFV      Full                                                        +---------+---------------+---------+-----------+----------+--------------+ POP      Full           Yes      Yes                                 +---------+---------------+---------+-----------+----------+--------------+ PTV      Full                                                        +---------+---------------+---------+-----------+----------+--------------+ PERO     Full                                                        +---------+---------------+---------+-----------+----------+--------------+   +---------+---------------+---------+-----------+----------+--------------+ LEFT     CompressibilityPhasicitySpontaneityPropertiesThrombus Aging +---------+---------------+---------+-----------+----------+--------------+ CFV      Full           Yes      Yes                                 +---------+---------------+---------+-----------+----------+--------------+ SFJ      Full                                                        +---------+---------------+---------+-----------+----------+--------------+  FV Prox  Full                                                        +---------+---------------+---------+-----------+----------+--------------+ FV Mid   Full                                                        +---------+---------------+---------+-----------+----------+--------------+ FV DistalFull                                                        +---------+---------------+---------+-----------+----------+--------------+ PFV      Full                                                        +---------+---------------+---------+-----------+----------+--------------+ POP      Full           Yes      Yes                                  +---------+---------------+---------+-----------+----------+--------------+ PTV      Full                                                        +---------+---------------+---------+-----------+----------+--------------+ PERO     Full                                                        +---------+---------------+---------+-----------+----------+--------------+     Summary: RIGHT: - There is no evidence of deep vein thrombosis in the lower extremity. However, portions of this examination were limited- see technologist comments above.  - No cystic structure found in the popliteal fossa.  LEFT: - There is no evidence of deep vein thrombosis in the lower extremity. However, portions of this examination were limited- see technologist comments above.  - No cystic structure found in the popliteal fossa.  *See table(s) above for measurements and observations. Electronically signed by Curt Jews MD on 12/28/2020 at 2:43:53 PM.    Final    ECHOCARDIOGRAM LIMITED  Result Date: 12/29/2020    ECHOCARDIOGRAM LIMITED REPORT   Patient Name:   Kathryn Buck Date of Exam: 12/29/2020 Medical Rec #:  [id number]     Height:       62.0 in Accession #:    [id number]    Weight:       220.9 lb Date of Birth:  August 04, 1986     BSA:  1.994 m Patient Age:    34 years      BP:           138/84 mmHg Patient Gender: F             HR:           64 bpm. Exam Location:  Inpatient Procedure: Limited Echo, Cardiac Doppler and Color Doppler Indications:    Elevated brain natriuretic peptide (BNP) level [329924]  History:        Patient has no prior history of Echocardiogram examinations.                 Risk Factors:Non-Smoker, Hypertension and Diabetes.  Sonographer:    Vickie Epley RDCS Referring Phys: [id number] Raegan Winders CALDWELL POWELL JR  Sonographer Comments: Covid positive. IMPRESSIONS  1. Left ventricular ejection fraction, by estimation, is 55 to 60%. The left ventricle has normal function. The left ventricle has no regional wall  motion abnormalities. Left ventricular diastolic parameters were normal.  2. Right ventricular systolic function is normal. The right ventricular size is normal. Tricuspid regurgitation signal is inadequate for assessing PA pressure.  3. The mitral valve is normal in structure. Trivial mitral valve regurgitation.  4. The aortic valve was not well visualized. Aortic valve regurgitation is not visualized. No aortic stenosis is present.  5. The inferior vena cava is dilated in size with <50% respiratory variability, suggesting right atrial pressure of 15 mmHg. FINDINGS  Left Ventricle: Left ventricular ejection fraction, by estimation, is 55 to 60%. The left ventricle has normal function. The left ventricle has no regional wall motion abnormalities. Left ventricular diastolic parameters were normal. Right Ventricle: The right ventricular size is normal. No increase in right ventricular wall thickness. Right ventricular systolic function is normal. Tricuspid regurgitation signal is inadequate for assessing PA pressure. Pericardium: Trivial pericardial effusion is present. Mitral Valve: The mitral valve is normal in structure. Trivial mitral valve regurgitation. Tricuspid Valve: The tricuspid valve is normal in structure. Tricuspid valve regurgitation is trivial. Aortic Valve: The aortic valve was not well visualized. Aortic valve regurgitation is not visualized. No aortic stenosis is present. Pulmonic Valve: The pulmonic valve was not well visualized. Pulmonic valve regurgitation is not visualized. Venous: The inferior vena cava is dilated in size with less than 50% respiratory variability, suggesting right atrial pressure of 15 mmHg. IAS/Shunts: The interatrial septum was not well visualized. LEFT VENTRICLE PLAX 2D LVOT diam:     2.00 cm LV SV:         73 LV SV Index:   37 LVOT Area:     3.14 cm  LV Volumes (MOD) LV vol d, MOD A2C: 130.0 ml LV vol d, MOD A4C: 144.0 ml LV vol s, MOD A2C: 52.1 ml LV vol s, MOD A4C: 66.6  ml LV SV MOD A2C:     77.9 ml LV SV MOD A4C:     144.0 ml LV SV MOD BP:      77.4 ml RIGHT VENTRICLE TAPSE (M-mode): 1.9 cm AORTIC VALVE LVOT Vmax:   117.00 cm/s LVOT Vmean:  74.600 cm/s LVOT VTI:    0.233 m  SHUNTS Systemic VTI:  0.23 m Systemic Diam: 2.00 cm Oswaldo Milian MD Electronically signed by Oswaldo Milian MD Signature Date/Time: 12/29/2020/11:10:21 AM    Final         Scheduled Meds: . enoxaparin (LOVENOX) injection  40 mg Subcutaneous Q12H  . insulin aspart  0-20 Units Subcutaneous TID WC  . insulin aspart  0-5 Units Subcutaneous QHS  . Ipratropium-Albuterol  2 puff Inhalation Q6H  . iron polysaccharides  150 mg Oral Daily  . labetalol  200 mg Oral BID  . levothyroxine  25 mcg Oral Q0600  . linagliptin  5 mg Oral Daily  . loratadine  10 mg Oral Daily  . methylPREDNISolone (SOLU-MEDROL) injection  60 mg Intravenous Q12H  . prenatal multivitamin  1 tablet Oral Q1200  . senna-docusate  2 tablet Oral Daily  . valACYclovir  500 mg Oral BID   Continuous Infusions: . sodium chloride Stopped (12/26/20 1902)     LOS: 3 days    Time spent: over 60 min    Fayrene Helper, MD [facility]   To contact the attending provider between 7A-7P or the covering provider during after hours 7P-7A, please log into the web site [email or web address] and access using universal [place] password for that web site. If you do not have the password, please call the hospital operator.  12/29/2020, 11:12 AM

## 2020-12-29 NOTE — Progress Notes (Signed)
   Patient Details Name: Kathryn Buck MRN: [id number] DOB: [birth year]   SATURATION QUALIFICATIONS: (This note is used to comply with regulatory documentation for home oxygen)  Patient Saturations on Room Air at Rest = 92%  Patient Saturations on Room Air while Ambulating = 86%  Patient Saturations on 2 Liters of oxygen while Ambulating = 93%  Please briefly explain why patient needs home oxygen: to improve saturations during physical activity such as ambulation  [name],[name] 12/29/2020, 12:55 PM Jannette Spanner PT, DPT Acute Rehabilitation Services Pager: [phone] Office: [phone]

## 2020-12-29 NOTE — Progress Notes (Signed)
  Echocardiogram 2D Echocardiogram has been performed.  Geoffery Lyons Swaim 12/29/2020, 10:50 AM

## 2020-12-29 NOTE — Plan of Care (Signed)

## 2020-12-29 NOTE — Evaluation (Signed)
Physical Therapy Evaluation Patient Details Name: Kathryn Buck MRN: [id number] DOB: Feb 01, 1986 Today's Date: 12/29/2020   History of Present Illness  35 y.o. female with medical history significant for hypertension, gestational diabetes, postpartum 10 days presented to [facility] ER with fever, cough, shortness of breath that was severe worsening over a 4-day period.  Patient reports she delivered her son on December 15, 2020 at [facility]. Pt admitted for Acute Hypoxic Respiratory Failure 2/2 COVID 19 Pneumonia  Clinical Impression  Patient evaluated by Physical Therapy with no further acute PT needs identified. All education has been completed and the patient has no further questions.  Pt mobilizing well and only reports mild dyspnea with activity. Pt is a Therapist, sports, and she is familiar with incentive spirometer and flutter and aware to take rest breaks needed.  Pt very pleasant and cooperative and hopeful to d/c home to her baby boy soon. See below for any follow-up Physical Therapy or equipment needs. PT is signing off. Thank you for this referral.      Follow Up Recommendations No PT follow up    Equipment Recommendations  None recommended by PT    Recommendations for Other Services       Precautions / Restrictions Precautions Precaution Comments: monitor sats      Mobility  Bed Mobility Overal bed mobility: Modified Independent                  Transfers Overall transfer level: Modified independent                  Ambulation/Gait Ambulation/Gait assistance: Modified independent (Device/Increase time) Gait Distance (Feet): 24 Feet (x2) Assistive device: None Gait Pattern/deviations: WFL(Within Functional Limits)     General Gait Details: pt ambulating around room without difficulty, reports mild SOB, however aware to take breaks as needed, performed oxygen saturations qualification (see separate progress note) and pt required 2L O2 [place] with  ambulation  Stairs            Wheelchair Mobility    Modified Rankin (Stroke Patients Only)       Balance                                             Pertinent Vitals/Pain Pain Assessment: No/denies pain    Home Living Family/patient expects to be discharged to:: Private residence Living Arrangements: Spouse/significant other Available Help at Discharge: Family;Available PRN/intermittently Type of Home: House       Home Layout: Able to live on main level with bedroom/bathroom Home Equipment: None      Prior Function Level of Independence: Independent               Hand Dominance        Extremity/Trunk Assessment   Upper Extremity Assessment Upper Extremity Assessment: Overall WFL for tasks assessed    Lower Extremity Assessment Lower Extremity Assessment: Overall WFL for tasks assessed    Cervical / Trunk Assessment Cervical / Trunk Assessment: Normal  Communication   Communication: No difficulties  Cognition Arousal/Alertness: Awake/alert Behavior During Therapy: WFL for tasks assessed/performed Overall Cognitive Status: Within Functional Limits for tasks assessed  General Comments      Exercises     Assessment/Plan    PT Assessment Patent does not need any further PT services  PT Problem List         PT Treatment Interventions      PT Goals (Current goals can be found in the Care Plan section)  Acute Rehab PT Goals PT Goal Formulation: All assessment and education complete, DC therapy    Frequency     Barriers to discharge        Co-evaluation               AM-PAC PT "6 Clicks" Mobility  Outcome Measure Help needed turning from your back to your side while in a flat bed without using bedrails?: None Help needed moving from lying on your back to sitting on the side of a flat bed without using bedrails?: None Help needed moving to and from a  bed to a chair (including a wheelchair)?: None Help needed standing up from a chair using your arms (e.g., wheelchair or bedside chair)?: None Help needed to walk in hospital room?: None Help needed climbing 3-5 steps with a railing? : A Little 6 Click Score: 23    End of Session Equipment Utilized During Treatment: Oxygen Activity Tolerance: Patient tolerated treatment well Patient left: Other (comment) (standing in room) Nurse Communication: Mobility status PT Visit Diagnosis: Difficulty in walking, not elsewhere classified (R26.2)    Time: 6599-3570 PT Time Calculation (min) (ACUTE ONLY): 18 min   Charges:   PT Evaluation $PT Eval Low Complexity: 1 Low        Kati PT, DPT Acute Rehabilitation Services Pager: [phone] Office: [phone]  York Ram E 12/29/2020, 1:18 PM

## 2020-12-30 ENCOUNTER — Inpatient Hospital Stay (HOSPITAL_COMMUNITY): Payer: Managed Care, Other (non HMO)

## 2020-12-30 DIAGNOSIS — U071 COVID-19: Secondary | ICD-10-CM | POA: Diagnosis not present

## 2020-12-30 DIAGNOSIS — J1282 Pneumonia due to coronavirus disease 2019: Secondary | ICD-10-CM | POA: Diagnosis not present

## 2020-12-30 LAB — CBC WITH DIFFERENTIAL/PLATELET
Abs Immature Granulocytes: 0.73 10*3/uL — ABNORMAL HIGH (ref 0.00–0.07)
Basophils Absolute: 0 10*3/uL (ref 0.0–0.1)
Basophils Relative: 0 %
Eosinophils Absolute: 0 10*3/uL (ref 0.0–0.5)
Eosinophils Relative: 0 %
HCT: 30.5 % — ABNORMAL LOW (ref 36.0–46.0)
Hemoglobin: 9.4 g/dL — ABNORMAL LOW (ref 12.0–15.0)
Immature Granulocytes: 5 %
Lymphocytes Relative: 18 %
Lymphs Abs: 2.4 10*3/uL (ref 0.7–4.0)
MCH: 26.7 pg (ref 26.0–34.0)
MCHC: 30.8 g/dL (ref 30.0–36.0)
MCV: 86.6 fL (ref 80.0–100.0)
Monocytes Absolute: 0.7 10*3/uL (ref 0.1–1.0)
Monocytes Relative: 6 %
Neutro Abs: 9.5 10*3/uL — ABNORMAL HIGH (ref 1.7–7.7)
Neutrophils Relative %: 71 %
Platelets: 677 10*3/uL — ABNORMAL HIGH (ref 150–400)
RBC: 3.52 MIL/uL — ABNORMAL LOW (ref 3.87–5.11)
RDW: 14.7 % (ref 11.5–15.5)
WBC: 13.4 10*3/uL — ABNORMAL HIGH (ref 4.0–10.5)
nRBC: 0.5 % — ABNORMAL HIGH (ref 0.0–0.2)

## 2020-12-30 LAB — CULTURE, BLOOD (ROUTINE X 2)
Culture: NO GROWTH
Culture: NO GROWTH
Special Requests: ADEQUATE
Special Requests: ADEQUATE

## 2020-12-30 LAB — COMPREHENSIVE METABOLIC PANEL
ALT: 36 U/L (ref 0–44)
AST: 20 U/L (ref 15–41)
Albumin: 2.5 g/dL — ABNORMAL LOW (ref 3.5–5.0)
Alkaline Phosphatase: 50 U/L (ref 38–126)
Anion gap: 12 (ref 5–15)
BUN: 18 mg/dL (ref 6–20)
CO2: 24 mmol/L (ref 22–32)
Calcium: 8.2 mg/dL — ABNORMAL LOW (ref 8.9–10.3)
Chloride: 105 mmol/L (ref 98–111)
Creatinine, Ser: 0.76 mg/dL (ref 0.44–1.00)
GFR, Estimated: >60 mL/min (ref >60)
Glucose, Bld: 105 mg/dL — ABNORMAL HIGH (ref 70–99)
Potassium: 3.7 mmol/L (ref 3.5–5.1)
Sodium: 141 mmol/L (ref 135–145)
Total Bilirubin: 0.5 mg/dL (ref 0.3–1.2)
Total Protein: 5.2 g/dL — ABNORMAL LOW (ref 6.5–8.1)

## 2020-12-30 LAB — GLUCOSE, CAPILLARY
Glucose-Capillary: 134 mg/dL — ABNORMAL HIGH (ref 70–99)
Glucose-Capillary: 131 mg/dL — ABNORMAL HIGH (ref 70–99)

## 2020-12-30 LAB — MAGNESIUM: Magnesium: 2.2 mg/dL (ref 1.7–2.4)

## 2020-12-30 LAB — D-DIMER, QUANTITATIVE: D-Dimer, Quant: 3.69 ug/mL-FEU — ABNORMAL HIGH (ref 0.00–0.50)

## 2020-12-30 LAB — PHOSPHORUS: Phosphorus: 4 mg/dL (ref 2.5–4.6)

## 2020-12-30 LAB — C-REACTIVE PROTEIN: CRP: 1.3 mg/dL — ABNORMAL HIGH (ref <1.0)

## 2020-12-30 LAB — FERRITIN: Ferritin: 30 ng/mL (ref 11–307)

## 2020-12-30 MED ORDER — FUROSEMIDE 10 MG/ML IJ SOLN: 20.0000 mg | Freq: Once | INTRAMUSCULAR | Status: DC

## 2020-12-30 MED ORDER — FUROSEMIDE 20 MG PO TABS: 20.0000 mg | ORAL_TABLET | Freq: Every day | ORAL | 0 refills | Status: AC

## 2020-12-30 MED ORDER — PREDNISONE 10 MG PO TABS: ORAL_TABLET | ORAL | 0 refills | Status: AC

## 2020-12-30 MED ORDER — FUROSEMIDE 40 MG PO TABS
40.0000 mg | ORAL_TABLET | Freq: Every day | ORAL | Status: DC
  Administered 2020-12-30: 40 mg via ORAL
  Filled 2020-12-30: qty 1

## 2020-12-30 MED ORDER — POTASSIUM CHLORIDE ER 10 MEQ PO TBCR: 20.0000 meq | EXTENDED_RELEASE_TABLET | Freq: Every day | ORAL | 0 refills | Status: AC

## 2020-12-30 NOTE — Progress Notes (Signed)
I spoke with pt by phone due to contact precautions. Pt sounded very excited about the possible of going home. She misses her children. She wanted to be positive and encouraging.  The chaplain offered caring and supportive presence, prayers and blessings and well wishes for this pt. Further visit will be offered.

## 2020-12-30 NOTE — Discharge Summary (Signed)
Physician Discharge Summary  Kathryn Buck WEX:[id number] DOB: 1986/12/14 DOA: 12/25/2020  PCP: Milus Height, PA  Admit date: 12/25/2020 Discharge date: 12/30/2020  Time spent: 40 minutes  Recommendations for Outpatient Follow-up:  1. Follow outpatient CBC/CMP 2. Started on lasix x5 days at discharge for heart failure exacerbation -> needs repeat evaluation and decision regarding need for continued lasix outpatient -> will need repeat chest x ray outpatient after completion of lasix course 3. Quarantine per CDC guidelines -> Until January 26th (based on home test) 4. Follow with PCP to determine how much longer she'll need oxygen  Discharge Diagnoses:  Principal Problem:   Pneumonia due to COVID-19 virus Active Problems:   Obesity   Hypertension   Hypothyroidism   Hypoxia   Personal history of thromboembolic disease   Gestational diabetes mellitus   Discharge Condition: stable  Diet recommendation: heart healthy  Filed Weights   12/25/20 1455 12/27/20 2057 12/29/20 0500  Weight: 102.1 kg 102 kg 100.2 kg    History of present illness:  Kathryn Buck Kathryn Buck 35 y.o.femalewith medical history significant forhypertension, gestational diabetes, postpartum 36 days presented to [facility] ER with fever, cough, shortness of breaththat wassevere worsening over Kathryn Buck 4-day period. Patient reports she delivered her son on December 15, 2020 at [facility]. She was discharged on December 17, 2020. January third 2022 she developed fever cough and tested positive for COVID at home. She has been isolating at home and using Tylenol as needed for fever but symptoms progressively worsened she became more short of breath prompting her to go to the emergency room.Chest x-ray consistent with COVID-pneumonia and repeat COVID test was positive. CT angiography of the chest was obtained to the emergency room which did not show Kathryn Buck pulmonary embolism. 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 She has Kathryn Buck wound VAC on the cesarean incision.Seen in the emergency room at [facility] on January 9 and was there until the night of January 10 awaiting for bed at [facility]. She reports she is feeling better at this time but is still requiring oxygen. She is able to speak in full sentences. CRP was elevated. She has been on remdesivir since the night of January 9.  She's improved with steroids and remdesivir as well as actemra.  She has bilateral pleural effusions and was started on lasix for Kathryn Buck short course.  She's been discharged with oxygen to use with activity.  Stable on 1/14 for discharge.  Hospital Course:  Acute Hypoxic Respiratory Failure 2/2 COVID 19 Pneumonia Currently requiring 4 L Kathryn Buck (weaned to 2 L at bedside today) CT PE 1/9 without PE, bilateral pleural effusions, extensive consolidation throughout both lower lobes and R midle lobe.  More patchy infiltrate within the upper lobes.  C/w widespread pneumonia. CXR 1/14 with multifocal pneumonia with bilateral pleural effusions and adjacent passive regions of atelectasis - partial atelectasis collapse of the right upper lobe as well Follow BNP (elevated -> echo with normal EF - see report) S/p actemra.  Continue steroids, remdesivir.  Discharge with steroid taper. Strict I/O, daily weights Prone as able, OOB, IS, flutter  COVID-19 Labs  Recent Labs    12/28/20 0446 12/29/20 0456 12/30/20 0425  DDIMER 2.69* 3.70* 3.69*  FERRITIN 42 34 30  CRP 4.7* 2.5* 1.3*    Lab Results  Component Value Date   SARSCOV2NAA POSITIVE (Kathryn Buck) 12/25/2020  [place] NEGATIVE 12/12/2020   [name] Not Detected 04/25/2020   HFpEF Exacerbation: CXR with bilateral effusions, trace LE edema on exam - suspect component of volume overload Will d/c with 5 days lasix, will need outpatient follow up (discussed with pharmacy given breastfeeding, discussed with  pt) Echo as below Needs outpatient follow up, repeat CXR - determination of need for additional lasix  Elevated D dimer Negative LE [place] CT PE protocol without PE D dimer fluctuating, but likely 2/2 inflammation with covid, follow  S/p Cesarean Section Well healing incision Follow outpatient with OB gyn  Hypothyroidism Continue synthroid  Hypertension Continue labetalol  Hx DVT Continue prophylactic lovenox  Gestational Diabetes Resume home meds  Procedures: Echo IMPRESSIONS    1. Left ventricular ejection fraction, by estimation, is 55 to 60%. The  left ventricle has normal function. The left ventricle has no regional  wall motion abnormalities. Left ventricular diastolic parameters were  normal.  2. Right ventricular systolic function is normal. The right ventricular  size is normal. Tricuspid regurgitation signal is inadequate for assessing  PA pressure.  3. The mitral valve is normal in structure. Trivial mitral valve  regurgitation.  4. The aortic valve was not well visualized. Aortic valve regurgitation  is not visualized. No aortic stenosis is present.  5. The inferior vena cava is dilated in size with <50% respiratory  variability, suggesting right atrial pressure of 15 mmHg.   LE [place] Summary:  RIGHT:  - There is no evidence of deep vein thrombosis in the lower extremity.  However, portions of this examination were limited- see technologist  comments above.    - No cystic structure found in the popliteal fossa.    LEFT:  - There is no evidence of deep vein thrombosis in the lower extremity.  However, portions of this examination were limited- see technologist  comments above.    - No cystic structure found in the popliteal fossa.   Consultations:  OBGYN  Discharge Exam: Vitals:   12/29/20 2056 12/30/20 0633  BP: 130/87 (!) 149/81  Pulse: 61 60  Resp: 18 18  Temp: 98.4 F (36.9 C) 98.3 F (36.8 C)  SpO2: 96% 94%   Feeling  better Eager to discharge home Still some SOB with exertion  General: No acute distress. Cardiovascular: Heart sounds show Kathryn Touchet regular rate, and rhythm Lungs: Clear to auscultation bilaterally  Abdomen: Soft, nontender, nondistended Neurological: Alert and oriented 3. Moves all extremities 4. Cranial nerves II through XII grossly intact. Skin: Warm and dry. No rashes or lesions. Extremities: No clubbing or cyanosis. Bilateral trace edema  Discharge Instructions   Discharge Instructions    (HEART FAILURE PATIENTS) Call MD:  Anytime you have any of the following symptoms: 1) 3 pound weight gain in 24 hours or 5 pounds in 1 week 2) shortness of breath, with or without Yanessa Hocevar dry hacking cough 3) swelling in the hands, feet or stomach 4) if you have to sleep on extra pillows at night in order to breathe.   Complete by: As directed    Call MD for:  difficulty breathing, headache or visual disturbances   Complete by: As directed    Call MD for:  extreme fatigue   Complete by: As directed    Call MD for:  hives   Complete by: As directed    Call MD for:  persistant dizziness or light-headedness   Complete by: As directed    Call MD for:  persistant nausea and vomiting   Complete  by: As directed    Call MD for:  redness, tenderness, or signs of infection (pain, swelling, redness, odor or green/yellow discharge around incision site)   Complete by: As directed    Call MD for:  severe uncontrolled pain   Complete by: As directed    Call MD for:  temperature >100.4   Complete by: As directed    Diet - low sodium heart healthy   Complete by: As directed    Discharge instructions   Complete by: As directed    You were seen for COVID 19 pneumonia.   You've improved and will discharge home with Mikia Delaluz steroid taper.  You will need to continue to quarantine for 21 days after your positive test (01/15/21).  You need oxygen with activity.  Continue to use 2 liters with activity and at night.  Follow up  with your PCP to determine when this can be discontinued.  Your imaging today was concerning for pleural effusions (fluid in space outside lungs).  I think this may represent volume overload, we'll send you home with 5 days of lasix (with potassium).   You'll need Dolores Mcgovern repeat chest x ray in 2-3 weeks to follow up and ensure the fluid is improving.  Your PCP should follow up with you to determine if you need any more lasix long term.  You'll also need repeat labs in 1-2 weeks.  Return for new, recurrent, or worsening symptoms.  Please ask your PCP to request records from this hospitalization so they know what was done and what the next steps will be.   Discharge wound care:   Complete by: As directed    Interdry to abdominal fold:  Measure and cut length of InterDry to fit in skin folds that have skin breakdown Tuck InterDry fabric into skin folds in Maya Scholer single layer, allow for 2 inches of overhang from skin edges to allow for wicking to occur May remove to bathe; dry area thoroughly and then tuck into affected areas again Do not apply any creams or ointments when using InterDry DO NOT THROW AWAY FOR 5 DAYS unless soiled with stool DO NOT [facility] product, this will inactivate the silver in the material  New sheet of Interdry should be applied after 5 days of use if patient continues to have skin breakdown  Silicone foam to blistering left abdomen.   Increase activity slowly   Complete by: As directed      Allergies as of 12/30/2020   No Known Allergies     Medication List    TAKE these medications   acetaminophen 325 MG tablet Commonly known as: TYLENOL Take 2 tablets (650 mg total) by mouth every 4 (four) hours as needed for mild pain (temperature > 101.5.).   cetirizine 10 MG tablet Commonly known as: ZYRTEC Take 10 mg by mouth daily as needed for allergies.   enoxaparin 40 MG/0.4ML injection Commonly known as: LOVENOX Inject 40 mg into the skin daily.   furosemide 20 MG  tablet Commonly known as: Lasix Take 1 tablet (20 mg total) by mouth daily for 5 days.   iron polysaccharides 150 MG capsule Commonly known as: NIFEREX Take 1 capsule (150 mg total) by mouth daily.   labetalol 200 MG tablet Commonly known as: NORMODYNE Take 200 mg by mouth 2 (two) times daily.   levothyroxine 25 MCG tablet Commonly known as: Levothroid Take 1 tablet (25 mcg total) by mouth daily before breakfast.   metFORMIN 500 MG tablet Commonly known as:  GLUCOPHAGE Take 500 mg by mouth 2 (two) times daily with Channelle Bottger meal.   oxyCODONE 5 MG immediate release tablet Commonly known as: Oxy IR/ROXICODONE Take 1 tablet (5 mg total) by mouth every 4 (four) hours as needed for moderate pain.   potassium chloride 10 MEQ tablet Commonly known as: KLOR-CON Take 2 tablets (20 mEq total) by mouth daily for 5 days.   predniSONE 10 MG tablet Commonly known as: DELTASONE Take 4 tablets (40 mg total) by mouth daily for 1 day, THEN 3 tablets (30 mg total) daily for 1 day, THEN 2 tablets (20 mg total) daily for 1 day, THEN 1 tablet (10 mg total) daily for 1 day. Start taking on: December 30, 2020   prenatal multivitamin Tabs tablet Take 1 tablet by mouth daily at 12 noon.   senna-docusate 8.6-50 MG tablet Commonly known as: Senokot-S Take 2 tablets by mouth daily.   valACYclovir 500 MG tablet Commonly known as: VALTREX Take 500 mg by mouth 2 (two) times daily.            Durable Medical Equipment  (From admission, onward)         Start     Ordered   12/30/20 1253  For home use only DME oxygen  Once       Comments: SATURATION QUALIFICATIONS: (This note is used to comply with regulatory documentation for home oxygen)  Patient Saturations on Room Air at Rest 90%  Patient Saturations on Room Air while Ambulating 87%  Patient Saturations on 2 Liters of oxygen while Ambulating 94   Please briefly explain why patient needs home oxygen: patient desats <88% with activity   Question Answer Comment  Length of Need 6 Months   Mode or (Route) Nasal cannula   Liters per Minute 2   Frequency Continuous (stationary and portable oxygen unit needed)   Oxygen conserving device Yes   Oxygen delivery system Gas      12/30/20 1253           Discharge Care Instructions  (From admission, onward)         Start     Ordered   12/30/20 0000  Discharge wound care:       Comments: Interdry to abdominal fold:  Measure and cut length of InterDry to fit in skin folds that have skin breakdown Tuck InterDry fabric into skin folds in Evon Dejarnett single layer, allow for 2 inches of overhang from skin edges to allow for wicking to occur May remove to bathe; dry area thoroughly and then tuck into affected areas again Do not apply any creams or ointments when using InterDry DO NOT THROW AWAY FOR 5 DAYS unless soiled with stool DO NOT [facility] product, this will inactivate the silver in the material  New sheet of Interdry should be applied after 5 days of use if patient continues to have skin breakdown  Silicone foam to blistering left abdomen.   12/30/20 1305         No Known Allergies  Follow-up Information    Christophe Louis, MD. Schedule an appointment as soon as possible for Zimal Weisensel visit in 4 week(s).   Specialty: Obstetrics and Gynecology Why: please make an appointment for Manuelita Moxon postpartum visit in 4 weeks  Contact information: [address] [place] [address] [phone]                The results of significant diagnostics from this hospitalization (including imaging, microbiology, ancillary and laboratory) are  listed below for reference.    Significant Diagnostic Studies: CT Angio Chest PE W and/or Wo Contrast  Result Date: 12/25/2020 CLINICAL DATA:  Postpartum. Coronavirus infection. Worsening shortness of breath. EXAM: CT ANGIOGRAPHY CHEST WITH CONTRAST TECHNIQUE: Multidetector CT imaging of the chest was performed using the standard protocol during  bolus administration of intravenous contrast. Multiplanar CT image reconstructions and MIPs were obtained to evaluate the vascular anatomy. CONTRAST:  77mL OMNIPAQUE IOHEXOL 350 MG/ML SOLN COMPARISON:  Radiography same day FINDINGS: Cardiovascular: The heart is mildly enlarged. Tiny amount of pericardial fluid. No aortic pathology. Pulmonary arterial opacification is good. There are no pulmonary emboli. Mediastinum/Nodes: No mass or lymphadenopathy. Lungs/Pleura: Bilateral pleural effusions layering dependently. Extensive consolidation throughout both lower lobes. More patchy infiltrate within the upper lobes. Moderate involvement of the right middle lobe. Upper Abdomen: Negative Musculoskeletal: Normal Review of the MIP images confirms the above findings. IMPRESSION: 1. No pulmonary emboli. 2. Bilateral pleural effusions layering dependently. Extensive consolidation throughout both lower lobes and the right middle lobe. More patchy infiltrate within the upper lobes. Findings consistent with widespread pneumonia. Electronically Signed   By: Nelson Chimes M.D.   On: 12/25/2020 17:29   DG CHEST PORT 1 VIEW  Result Date: 12/30/2020 CLINICAL DATA:  COVID-19, shortness of breath EXAM: PORTABLE CHEST 1 VIEW COMPARISON:  CT 12/25/2020, radiograph 12/25/2020 FINDINGS: Persistent heterogeneous bilateral opacities. Bilateral pleural effusions with adjacent passive regions of atelectasis are present. No pneumothorax. Some increased upper right paramediastinal abnormality may reflect partial atelectatic collapse of the right upper lobe. Otherwise stable cardiomediastinal contours. No acute osseous or soft tissue abnormality. External support devices overlie the chest. IMPRESSION: Persistent bilateral heterogeneous opacities compatible with multifocal pneumonia with bilateral pleural effusions and adjacent passive regions of atelectasis. Suspect some partial atelectatic collapse of the right upper lobe as well. Electronically  Signed   By: Lovena Le M.D.   On: 12/30/2020 06:48   DG Chest Port 1 View  Result Date: 12/25/2020 CLINICAL DATA:  Shortness of breath for 3 days. COVID positive. Recent Caesarean section. EXAM: PORTABLE CHEST 1 VIEW COMPARISON:  None. FINDINGS: Low lung volumes. Upper normal heart size. Hazy bilateral lung base opacities likely combination pleural effusions and airspace disease. Additional patchy opacity in the perihilar region and left mid lung. Mild diffuse peribronchial thickening. No pneumothorax. No acute osseous abnormalities are seen. IMPRESSION: 1. Low lung volumes with borderline cardiomegaly and hazy bilateral lung base opacities likely combination pleural effusions and airspace disease. 2. Patchy perihilar and left midlung opacities, edema versus multifocal infection. Electronically Signed   By: Keith Rake M.D.   On: 12/25/2020 16:15   VAS [place] LOWER EXTREMITY VENOUS (DVT)  Result Date: 12/28/2020  Lower Venous DVT Study Indications: Elevated Ddimer.  Risk Factors: COVID 19 positive. Limitations: Body habitus and poor ultrasound/tissue interface. Comparison Study: No prior studies. Performing Technologist: Oliver Hum RVT  Examination Guidelines: Wilhelmina Hark complete evaluation includes B-mode imaging, spectral Doppler, color Doppler, and power Doppler as needed of all accessible portions of each vessel. Bilateral testing is considered an integral part of Candus Braud complete examination. Limited examinations for reoccurring indications may be performed as noted. The reflux portion of the exam is performed with the patient in reverse Trendelenburg.  +---------+---------------+---------+-----------+----------+--------------+ RIGHT    CompressibilityPhasicitySpontaneityPropertiesThrombus Aging +---------+---------------+---------+-----------+----------+--------------+ CFV      Full           Yes      Yes                                  +---------+---------------+---------+-----------+----------+--------------+  SFJ      Full                                                        +---------+---------------+---------+-----------+----------+--------------+ FV Prox  Full                                                        +---------+---------------+---------+-----------+----------+--------------+ FV Mid   Full                                                        +---------+---------------+---------+-----------+----------+--------------+ FV DistalFull                                                        +---------+---------------+---------+-----------+----------+--------------+ PFV      Full                                                        +---------+---------------+---------+-----------+----------+--------------+ POP      Full           Yes      Yes                                 +---------+---------------+---------+-----------+----------+--------------+ PTV      Full                                                        +---------+---------------+---------+-----------+----------+--------------+ PERO     Full                                                        +---------+---------------+---------+-----------+----------+--------------+   +---------+---------------+---------+-----------+----------+--------------+ LEFT     CompressibilityPhasicitySpontaneityPropertiesThrombus Aging +---------+---------------+---------+-----------+----------+--------------+ CFV      Full           Yes      Yes                                 +---------+---------------+---------+-----------+----------+--------------+ SFJ      Full                                                        +---------+---------------+---------+-----------+----------+--------------+  FV Prox  Full                                                         +---------+---------------+---------+-----------+----------+--------------+ FV Mid   Full                                                        +---------+---------------+---------+-----------+----------+--------------+ FV DistalFull                                                        +---------+---------------+---------+-----------+----------+--------------+ PFV      Full                                                        +---------+---------------+---------+-----------+----------+--------------+ POP      Full           Yes      Yes                                 +---------+---------------+---------+-----------+----------+--------------+ PTV      Full                                                        +---------+---------------+---------+-----------+----------+--------------+ PERO     Full                                                        +---------+---------------+---------+-----------+----------+--------------+     Summary: RIGHT: - There is no evidence of deep vein thrombosis in the lower extremity. However, portions of this examination were limited- see technologist comments above.  - No cystic structure found in the popliteal fossa.  LEFT: - There is no evidence of deep vein thrombosis in the lower extremity. However, portions of this examination were limited- see technologist comments above.  - No cystic structure found in the popliteal fossa.  *See table(s) above for measurements and observations. Electronically signed by Gretta Beganodd Early MD on 12/28/2020 at 2:43:53 PM.    Final    ECHOCARDIOGRAM LIMITED  Result Date: 12/29/2020    ECHOCARDIOGRAM LIMITED REPORT   Patient Name:   Vernard GamblesRENEE M Pittinger Date of Exam: 12/29/2020 Medical Rec #:  [id number]     Height:       62.0 in Accession #:    [id number]    Weight:       220.9 lb Date of Birth:  10/25/1986     BSA:  1.994 m Patient Age:    34 years      BP:           138/84 mmHg Patient Gender: F              HR:           64 bpm. Exam Location:  Inpatient Procedure: Limited Echo, Cardiac Doppler and Color Doppler Indications:    Elevated brain natriuretic peptide (BNP) level [id number]  History:        Patient has no prior history of Echocardiogram examinations.                 Risk Factors:Non-Smoker, Hypertension and Diabetes.  Sonographer:    Vickie Epley RDCS Referring Phys: [phone] Graciemae Delisle CALDWELL POWELL JR  Sonographer Comments: Covid positive. IMPRESSIONS  1. Left ventricular ejection fraction, by estimation, is 55 to 60%. The left ventricle has normal function. The left ventricle has no regional wall motion abnormalities. Left ventricular diastolic parameters were normal.  2. Right ventricular systolic function is normal. The right ventricular size is normal. Tricuspid regurgitation signal is inadequate for assessing PA pressure.  3. The mitral valve is normal in structure. Trivial mitral valve regurgitation.  4. The aortic valve was not well visualized. Aortic valve regurgitation is not visualized. No aortic stenosis is present.  5. The inferior vena cava is dilated in size with <50% respiratory variability, suggesting right atrial pressure of 15 mmHg. FINDINGS  Left Ventricle: Left ventricular ejection fraction, by estimation, is 55 to 60%. The left ventricle has normal function. The left ventricle has no regional wall motion abnormalities. Left ventricular diastolic parameters were normal. Right Ventricle: The right ventricular size is normal. No increase in right ventricular wall thickness. Right ventricular systolic function is normal. Tricuspid regurgitation signal is inadequate for assessing PA pressure. Pericardium: Trivial pericardial effusion is present. Mitral Valve: The mitral valve is normal in structure. Trivial mitral valve regurgitation. Tricuspid Valve: The tricuspid valve is normal in structure. Tricuspid valve regurgitation is trivial. Aortic Valve: The aortic valve was not well visualized. Aortic  valve regurgitation is not visualized. No aortic stenosis is present. Pulmonic Valve: The pulmonic valve was not well visualized. Pulmonic valve regurgitation is not visualized. Venous: The inferior vena cava is dilated in size with less than 50% respiratory variability, suggesting right atrial pressure of 15 mmHg. IAS/Shunts: The interatrial septum was not well visualized. LEFT VENTRICLE PLAX 2D LVOT diam:     2.00 cm LV SV:         73 LV SV Index:   37 LVOT Area:     3.14 cm  LV Volumes (MOD) LV vol d, MOD A2C: 130.0 ml LV vol d, MOD A4C: 144.0 ml LV vol s, MOD A2C: 52.1 ml LV vol s, MOD A4C: 66.6 ml LV SV MOD A2C:     77.9 ml LV SV MOD A4C:     144.0 ml LV SV MOD BP:      77.4 ml RIGHT VENTRICLE TAPSE (M-mode): 1.9 cm AORTIC VALVE LVOT Vmax:   117.00 cm/s LVOT Vmean:  74.600 cm/s LVOT VTI:    0.233 m  SHUNTS Systemic VTI:  0.23 m Systemic Diam: 2.00 cm Oswaldo Milian MD Electronically signed by Oswaldo Milian MD Signature Date/Time: 12/29/2020/11:10:21 AM    Final     Microbiology: Recent Results (from the past 240 hour(s))  Blood Culture (routine x 2)     Status: None   Collection Time: 12/25/20  3:20 PM  Specimen: BLOOD  Result Value Ref Range Status   Specimen Description   Final    BLOOD RIGHT ANTECUBITAL Performed at [facility], [place] [address] [place], [place] [address]    Special Requests   Final    BOTTLES DRAWN AEROBIC AND ANAEROBIC Blood Culture adequate volume Performed at [facility], [address] [place], [place] [address]    Culture   Final    NO GROWTH 5 DAYS Performed at [facility], [place] [address] [place], [place] [address]    Report Status 12/30/2020 FINAL  Final  Blood Culture (routine x 2)     Status: None   Collection Time: 12/25/20  3:45 PM   Specimen: BLOOD LEFT HAND  Result Value Ref Range Status   Specimen Description   Final    BLOOD LEFT HAND Performed at [facility], [address] [place], [place]  [address]    Special Requests   Final    BOTTLES DRAWN AEROBIC AND ANAEROBIC Blood Culture adequate volume Performed at [facility], [address] [place], [place] [address]    Culture   Final    NO GROWTH 5 DAYS Performed at [facility], [place] [address], [place], [place] [address]    Report Status 12/30/2020 FINAL  Final  Resp Panel by RT-PCR (Flu Sheilla Maris&B, Covid) Peripheral     Status: Abnormal   Collection Time: 12/25/20  3:58 PM   Specimen: Peripheral; Nasopharyngeal(NP) swabs in vial transport medium  Result Value Ref Range Status   SARS Coronavirus 2 by RT PCR POSITIVE (Candon Caras) NEGATIVE Final    Comment: RESULT CALLED TO, READ BACK BY AND VERIFIED WITH: REED C RN [phone] [id number] PHILLIPS C (NOTE) SARS-CoV-2 target nucleic acids are DETECTED.  The SARS-CoV-2 RNA is generally detectable in upper respiratory specimens during the acute phase of infection. Positive results are indicative of the presence of the identified virus, but do not rule out bacterial infection or co-infection with other pathogens not detected by the test. Clinical correlation with patient history and other diagnostic information is necessary to determine patient infection status. The expected result is Negative.  Fact Sheet for Patients: EntrepreneurPulse.com.au  Fact Sheet for Healthcare Providers: IncredibleEmployment.be  This test is not yet approved or cleared by the [place] FDA and  has been authorized for detection and/or diagnosis of SARS-CoV-2 by FDA under an Emergency Use Authorization (EUA).  This EUA will remain in effect (meaning this test can be  used) for the duration of  the COVID-19 declaration under Section 564(b)(1) of the Act, 21 U.S.C. section 360bbb-3(b)(1), unless the authorization is terminated or revoked sooner.     Influenza Maelynn Moroney by PCR NEGATIVE NEGATIVE Final   Influenza B by PCR NEGATIVE NEGATIVE Final    Comment: (NOTE) The  Xpert Xpress SARS-CoV-2/FLU/RSV plus assay is intended as an aid in the diagnosis of influenza from Nasopharyngeal swab specimens and should not be used as [employer] sole basis for treatment. Nasal washings and aspirates are unacceptable for Xpert Xpress SARS-CoV-2/FLU/RSV testing.  Fact Sheet for Patients: EntrepreneurPulse.com.au  Fact Sheet for Healthcare Providers: IncredibleEmployment.be  This test is not yet approved or cleared by the [place] FDA and has been authorized for detection and/or diagnosis of SARS-CoV-2 by FDA under an Emergency Use Authorization (EUA). This EUA will remain in effect (meaning this test can be used) for the duration of the COVID-19 declaration under Section 564(b)(1) of  the Act, 21 U.S.C. section 360bbb-3(b)(1), unless the authorization is terminated or revoked.  Performed at [facility], [address] [place], [place] [address]      Labs: Basic Metabolic Panel: Recent Labs  Lab 12/26/20 0517 12/27/20 0438 12/28/20 0446 12/29/20 0456 12/30/20 0425  NA 139 140 139 140 141  K 4.2 3.7 3.7 3.8 3.7  CL 105 106 104 105 105  CO2 24 25 24 27 24   GLUCOSE 97 156* 168* 126* 105*  BUN 11 16 17 19 18   CREATININE 0.68 0.69  0.79 0.85 0.85 0.76  CALCIUM 8.3* 8.3* 8.2* 8.1* 8.2*  MG 2.1 2.0 2.1 2.1 2.2  PHOS 3.4 3.7 4.0 4.2 4.0   Liver Function Tests: Recent Labs  Lab 12/26/20 0517 12/27/20 0438 12/28/20 0446 12/29/20 0456 12/30/20 0425  AST 28 32 37 24 20  ALT 26 33 43 41 36  ALKPHOS 62 57 54 50 50  BILITOT 0.3 0.3 0.1* 0.4 0.5  PROT 6.1* 6.0* 5.4* 5.4* 5.2*  ALBUMIN 2.6* 2.6* 2.3* 2.4* 2.5*   No results for input(s): LIPASE, AMYLASE in the last 168 hours. No results for input(s): AMMONIA in the last 168 hours. CBC: Recent Labs  Lab 12/26/20 0517 12/27/20 0438 12/28/20 0446 12/29/20 0456 12/30/20 0425  WBC 7.4 9.3 11.2* 13.6* 13.4*  NEUTROABS 6.0 7.9* 9.0* 10.5* 9.5*  HGB 8.9*  9.2* 8.9* 9.5* 9.4*  HCT 27.9* 29.1* 28.2* 30.2* 30.5*  MCV 85.1 86.6 86.2 85.8 86.6  PLT 264 464* 519* 626* 677*   Cardiac Enzymes: No results for input(s): CKTOTAL, CKMB, CKMBINDEX, TROPONINI in the last 168 hours. BNP: BNP (last 3 results) Recent Labs    12/28/20 1523  BNP 281.4*    ProBNP (last 3 results) No results for input(s): PROBNP in the last 8760 hours.  CBG: Recent Labs  Lab 12/29/20 0747 12/29/20 1148 12/29/20 2053 12/30/20 0753 12/30/20 1132  GLUCAP 187* 102* 153* 134* 131*       Signed:  Fayrene Helper MD.  [facility] 12/30/2020, 1:14 PM

## 2020-12-30 NOTE — Progress Notes (Addendum)
SATURATION QUALIFICATIONS: (This note is used to comply with regulatory documentation for home oxygen)  Patient Saturations on Room Air at Rest 90%  Patient Saturations on Room Air while Ambulating 87%  Patient Saturations on 2 Liters of oxygen while Ambulating 94   Please briefly explain why patient needs home oxygen: To increase endurance and provide sufficient oxygen to the body when participating in ADL and ambulating.  Theora Gianotti, RN,BSN-BC

## 2020-12-30 NOTE — Plan of Care (Signed)
  Problem: Education: Goal: Knowledge of General Education information will improve Description: Including pain rating scale, medication(s)/side effects and non-pharmacologic comfort measures Outcome: Progressing   Problem: Health Behavior/Discharge Planning: Goal: Ability to manage health-related needs will improve Outcome: Progressing   Problem: Clinical Measurements: Goal: Will remain free from infection Outcome: Progressing Goal: Diagnostic test results will improve Outcome: Progressing Goal: Respiratory complications will improve Outcome: Progressing Goal: Cardiovascular complication will be avoided Outcome: Progressing   Problem: Education: Goal: Knowledge of General Education information will improve Description: Including pain rating scale, medication(s)/side effects and non-pharmacologic comfort measures Outcome: Progressing   Problem: Health Behavior/Discharge Planning: Goal: Ability to manage health-related needs will improve Outcome: Progressing   Problem: Clinical Measurements: Goal: Will remain free from infection Outcome: Progressing Goal: Diagnostic test results will improve Outcome: Progressing Goal: Respiratory complications will improve Outcome: Progressing Goal: Cardiovascular complication will be avoided Outcome: Progressing

## 2020-12-30 NOTE — TOC Progression Note (Addendum)
Transition of Care [facility]) - Progression Note    Patient Details  Name: Kathryn Buck MRN: [id number] Date of Birth: Jan 11, 1986  [facility]) CM/SW Contact  Ross Ludwig, [place] Phone Number: 12/30/2020, 1:53 PM  Clinical Narrative:     Patient will be going home with home oxygen.  CSW signing off please reconsult with any other social work needs, home oxygen [employer] agency has been notified of planned discharge and oxygen to be delivered today.     Barriers to Discharge: Barriers Resolved  Expected Discharge Plan and Services           Expected Discharge Date: 12/30/20               DME Arranged: Oxygen DME Agency: Other - Comment [employer]) Date DME Agency Contacted: 12/30/20 Time DME Agency Contacted: 0100 Representative spoke with at DME Agency: [place] ([place]) Interventions    Readmission Risk Interventions No flowsheet data found.

## 2020-12-30 NOTE — Progress Notes (Signed)
Pt discharged to home, instructions reviewed with pt. Pt acknowledged understanding of instructions. Full Covid teaching reviewed with patient. SRP, RN

## 2020-12-30 NOTE — TOC Transition Note (Signed)
Transition of Care [facility]) - CM/SW Discharge Note   Patient Details  Name: Kathryn Buck MRN: [id number] Date of Birth: 30-Jan-1986  Transition of Care [facility]) CM/SW Contact:  Ross Ludwig, LCSW Phone Number: 12/30/2020, 5:16 PM   Clinical Narrative:     Patient will be going home with home oxygen through [place].  CSW signing off please reconsult with any other social work needs, home oxygen  DME agency has been notified of planned discharge.    Final next level of care: Home/Self Care Barriers to Discharge: Barriers Resolved   Patient Goals and CMS Choice Patient states their goals for this hospitalization and ongoing recovery are:: To return home and eventually get off the oxygen. CMS Medicare.gov Compare Post Acute Care list provided to:: Patient Choice offered to / list presented to : Patient  Discharge Placement  Patient discharging back home.                     Discharge Plan and Services                DME Arranged: Oxygen DME Agency: Other - Comment [facility]) Date DME Agency Contacted: 12/30/20 Time DME Agency Contacted: 0100 Representative spoke with at DME Agency: [place]) Interventions     Readmission Risk Interventions No flowsheet data found.

## 2021-01-13 ENCOUNTER — Ambulatory Visit
Admission: RE | Admit: 2021-01-13 | Discharge: 2021-01-13 | Disposition: A | Discharge status: Home / Self Care | Payer: Managed Care, Other (non HMO) | Source: Ambulatory Visit | Attending: Physician Assistant | Admitting: Physician Assistant

## 2021-01-13 ENCOUNTER — Other Ambulatory Visit: Payer: Self-pay | Admitting: Physician Assistant

## 2021-01-13 DIAGNOSIS — J90 Pleural effusion, not elsewhere classified: Secondary | ICD-10-CM

## 2021-01-31 ENCOUNTER — Other Ambulatory Visit (HOSPITAL_COMMUNITY): Payer: Self-pay | Admitting: Obstetrics and Gynecology

## 2021-03-29 ENCOUNTER — Ambulatory Visit: Payer: Managed Care, Other (non HMO) | Attending: Internal Medicine

## 2021-03-29 DIAGNOSIS — Z23 Encounter for immunization: Secondary | ICD-10-CM

## 2021-04-03 ENCOUNTER — Other Ambulatory Visit (HOSPITAL_BASED_OUTPATIENT_CLINIC_OR_DEPARTMENT_OTHER): Payer: Self-pay

## 2021-04-03 MED ORDER — MODERNA COVID-19 VACCINE 100 MCG/0.5ML IM SUSP
INTRAMUSCULAR | 0 refills | Status: AC
  Filled 2021-04-03: qty 0.5, 1d supply, fill #0

## 2021-04-26 ENCOUNTER — Ambulatory Visit: Payer: Managed Care, Other (non HMO)

## 2021-04-26 ENCOUNTER — Telehealth: Payer: Self-pay | Admitting: *Deleted

## 2021-04-26 NOTE — Telephone Encounter (Signed)
Rescheduled 2nd vaccine appointment to 05/05/21 at [facility].

## 2021-05-05 ENCOUNTER — Ambulatory Visit: Payer: Managed Care, Other (non HMO)

## 2021-05-09 ENCOUNTER — Telehealth: Payer: Self-pay | Admitting: *Deleted

## 2021-05-09 NOTE — Telephone Encounter (Signed)
Rescheduled Covid vaccine #2 for patient at [facility] for 05/19/21.

## 2021-05-18 IMAGING — US US MFM OB FOLLOW-UP
1 series · 13 of 28 positions shown · non-contrast
Comparison: none

[Series 1: us mfm ob follow-up · 33 acquisitions, 13 frames shown]
[im 2/33]
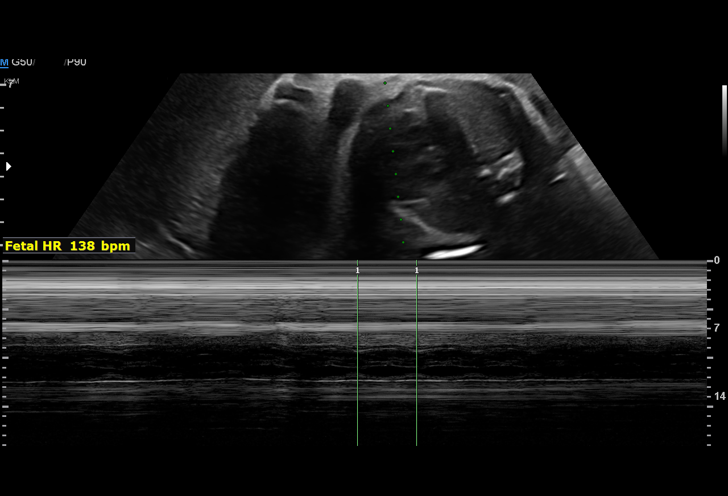
[im 4/33]
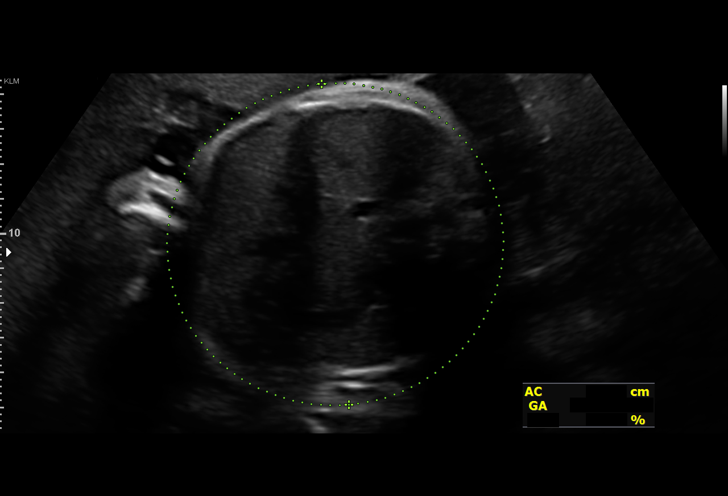
[im 6/33]
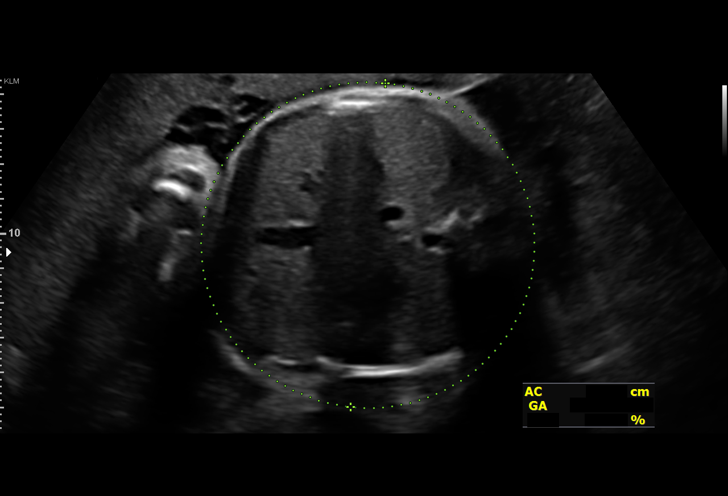
[im 9/33]
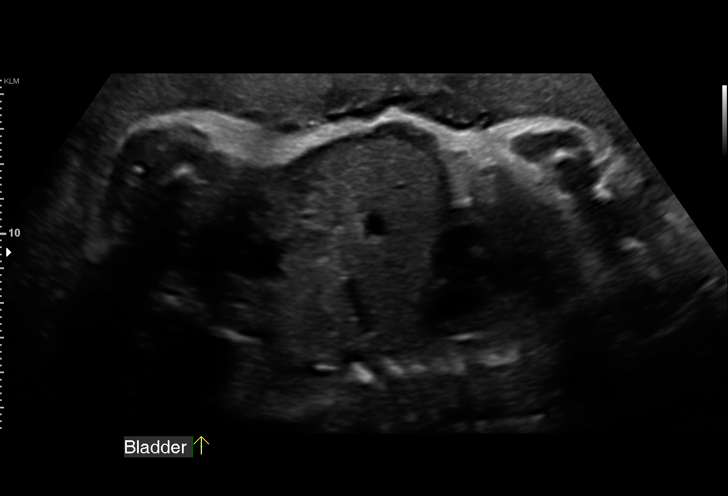
[im 11/33]
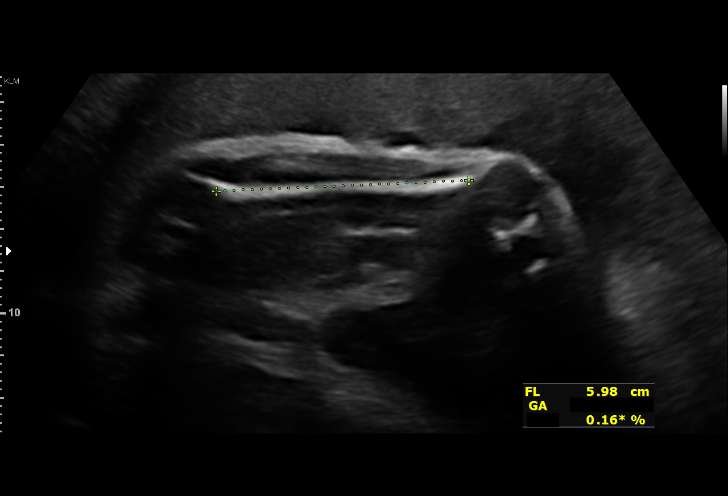
[im 14/33]
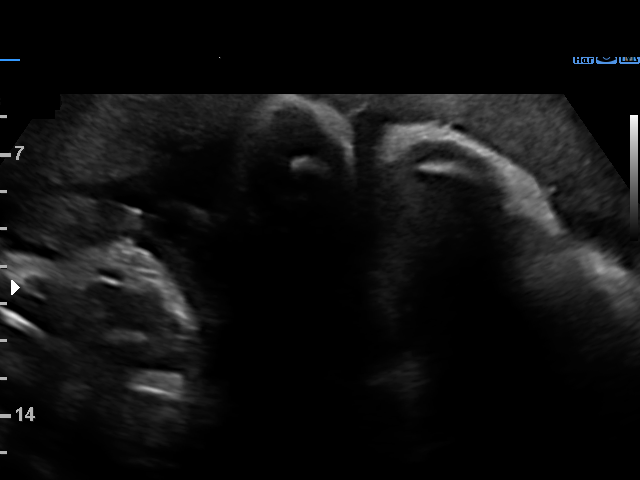
[im 17/33]
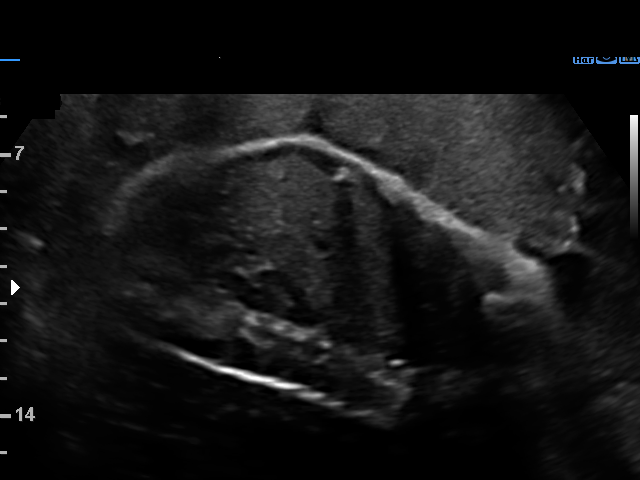
[im 19/33]
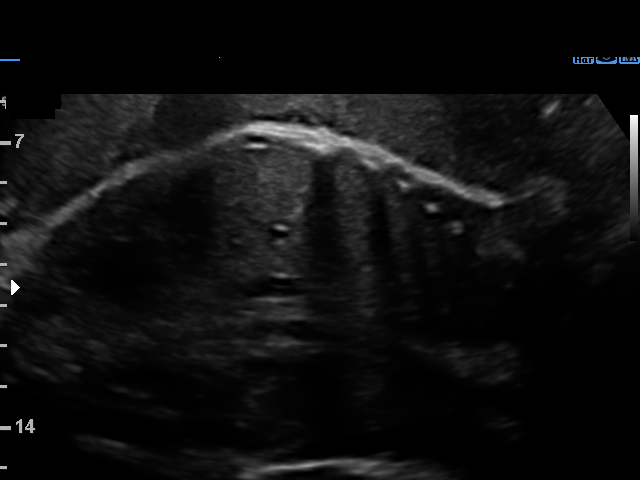
[im 22/33]
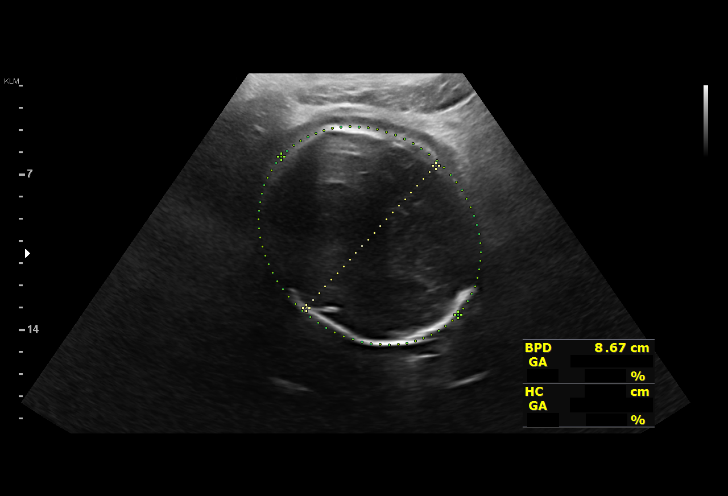
[im 24/33]
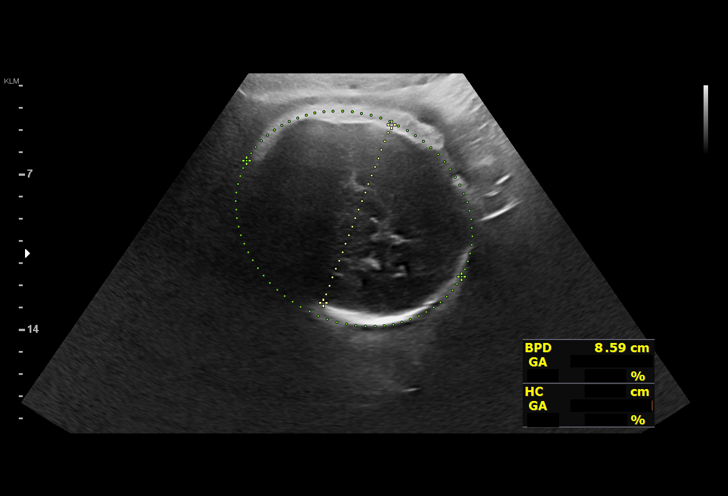
[im 27/33]
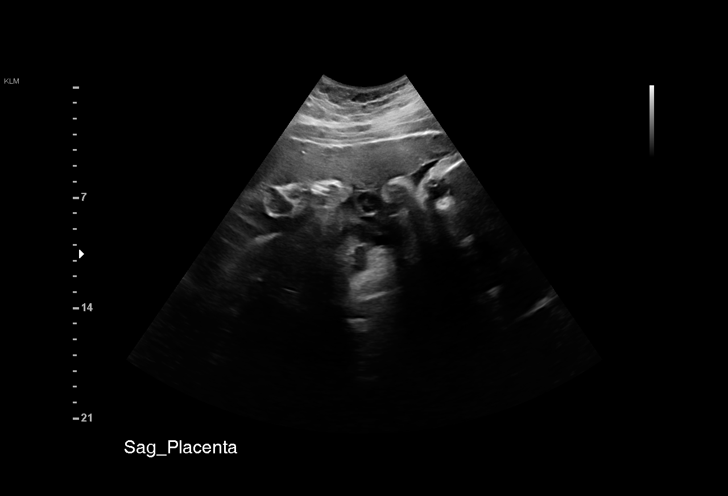
[im 29/33]
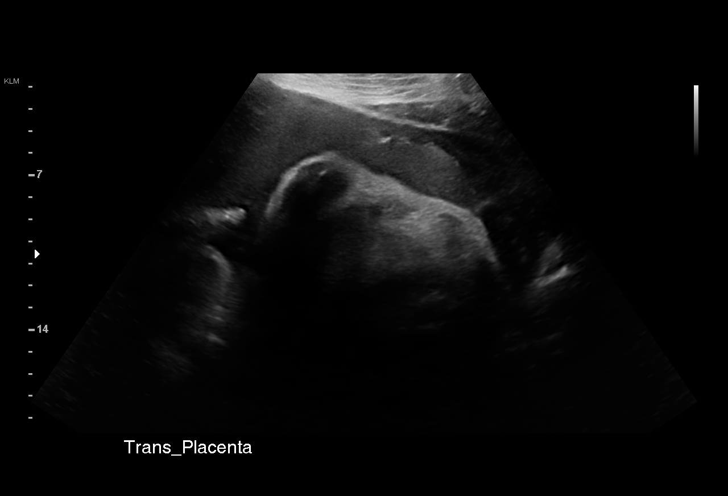
[im 31/33]
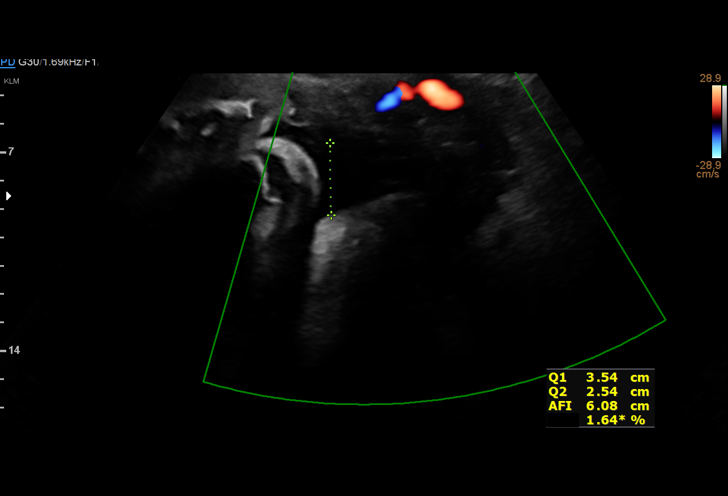

[13 of 28 positions shown; findings below may reference images not displayed]

Indications

 Diabetes - Pregestational,3rd trimester
 (metformin)
 35 weeks gestation of pregnancy
 Hypertension - Chronic/Pre-existing
 (labetalol)
 Obesity complicating pregnancy, third
 trimester
 Hypothyroid
Fetal Evaluation

 Num Of Fetuses:         1
 Fetal Heart Rate(bpm):  138
 Cardiac Activity:       Observed
 Presentation:           Cephalic
 Placenta:               Anterior
 P. Cord Insertion:      Previously Visualized

 Amniotic Fluid
 AFI FV:      Within normal limits

 AFI Sum(cm)     %Tile       Largest Pocket(cm)
 9.09            14
 RUQ(cm)       RLQ(cm)       LUQ(cm)        LLQ(cm)

Biophysical Evaluation

 Amniotic F.V:   Within normal limits       F. Tone:        Observed
 F. Movement:    Observed                   Score:          [DATE]
 F. Breathing:   Observed
Biometry

 BPD:      86.3  mm     G. Age:  34w 6d         47  %    CI:        75.65   %    70 - 86
                                                         FL/HC:      19.7   %    20.1 -
 HC:      314.6  mm     G. Age:  35w 2d         23  %    HC/AC:      1.07        0.93 -
 AC:      294.1  mm     G. Age:  33w 3d         15  %    FL/BPD:     71.7   %    71 - 87
 FL:       61.9  mm     G. Age:  32w 1d        1.2  %    FL/AC:      21.0   %    20 - 24

 Est. FW:    0738  gm    4 lb 13 oz      10  %
OB History

 Gravidity:    1
Gestational Age

 LMP:           35w 0d        Date:  03/18/20                 EDD:   12/23/20
 Clinical EDD:  35w 0d                                        EDD:   12/23/20
 U/S Today:     34w 0d                                        EDD:   12/30/20
 Best:          35w 0d     Det. By:  LMP  (03/18/20)          EDD:   12/23/20
Anatomy

 Cranium:               Appears normal         Aortic Arch:            Previously seen
 Cavum:                 Previously seen        Ductal Arch:            Previously seen
 Ventricles:            Appears normal         Diaphragm:              Appears normal
 Choroid Plexus:        Previously seen        Stomach:                Appears normal, left
                                                                       sided
 Cerebellum:            Appears normal         Abdomen:                Previously seen
 Posterior Fossa:       Previously seen        Abdominal Wall:         Previously seen
 Nuchal Fold:           Not applicable (>20    Cord Vessels:           Previously seen
                        wks GA)
 Face:                  Orbits and profile     Kidneys:                Appear normal
                        previously seen
 Lips:                  Previously seen        Bladder:                Appears normal
 Thoracic:              Previously seen        Spine:                  Previously seen
 Heart:                 Previously seen        Upper Extremities:      Previously seen
 RVOT:                  Previously seen        Lower Extremities:      Previously seen
 LVOT:                  Previously seen

 Other:  Technically difficult due to advanced GA, maternal habitus, and fetal
         position.
Impression

 Pregestational diabetes.  Patient takes Metformin for control.
 She reports her diabetes is well controlled.
 Chronic hypertension.  Well controlled on labetalol.  Blood
 pressure today at her office is 114/72 mmHg.
 Patient has hypothyroidism.  She takes levothyroxine
 supplements.

 On today's ultrasound, fetal growth is appropriate for
 gestational age.  The estimated fetal weight is at the 10th
 percentile.  Abdominal circumference is at the 15th
 percentile.  Amniotic fluid is normal and good fetal activity
 seen.  Antenatal testing is reassuring.  BPP [DATE].
 We reassured the patient of the findings.

 Patient has a history of deep vein thrombosis about 20 years
 ago.  She was taking oral contraceptives.  She has been
 reluctant to take heparin prophylaxis.  I counseled her on the
 importance of taking prophylactic heparin.
 If she is unable to take heparin now, prophylactic heparin
 should be strongly considered after delivery.
 Patient is considering taking heparin for now.
Recommendations

 -An appointment was made for her to return in 3 weeks for
 fetal growth assessment.
 -Weekly BPP that may be performed at your office.
 -Patient is willing to take heparin, I recommend
 unfractionated heparin 10,000 units twice daily till delivery
 and then switch to Lovenox 40 mg daily.
                 Nivia, Djalmir

## 2021-05-19 ENCOUNTER — Ambulatory Visit: Payer: Managed Care, Other (non HMO)

## 2021-05-19 NOTE — Progress Notes (Signed)
   Covid-19 Vaccination Clinic  Name:  JERZI TIGERT    MRN: [id number] DOB: January 11, 1986  05/19/2021  Ms. Castello was observed post Covid-19 immunization for 15 minutes without incident. She was provided with Vaccine Information Sheet and instruction to access the V-Safe system.   Ms. Reierson was instructed to call 911 with any severe reactions post vaccine: Marland Kitchen Difficulty breathing  . Swelling of face and throat  . A fast heartbeat  . A bad rash all over body  . Dizziness and weakness

## 2021-05-23 ENCOUNTER — Other Ambulatory Visit (HOSPITAL_BASED_OUTPATIENT_CLINIC_OR_DEPARTMENT_OTHER): Payer: Self-pay

## 2021-05-23 MED ORDER — PFIZER-BIONT COVID-19 VAC-TRIS 30 MCG/0.3ML IM SUSP
INTRAMUSCULAR | 0 refills | Status: AC
  Filled 2021-05-23: qty 0.3, 1d supply, fill #0

## 2021-07-06 ENCOUNTER — Other Ambulatory Visit (HOSPITAL_COMMUNITY): Payer: Self-pay

## 2021-07-13 IMAGING — DX DG CHEST 2V
2 series · 2 of 2 positions shown · non-contrast
Comparison: Chest radiograph 12/30/2020.

CLINICAL DATA: Pleural effusion. Additional history provided by
technologist: Patient diagnosed with G25K2-B3 on the ninth of this
month.

EXAM:
CHEST - 2 VIEW

[dg chest 2 view (1 of 2)]
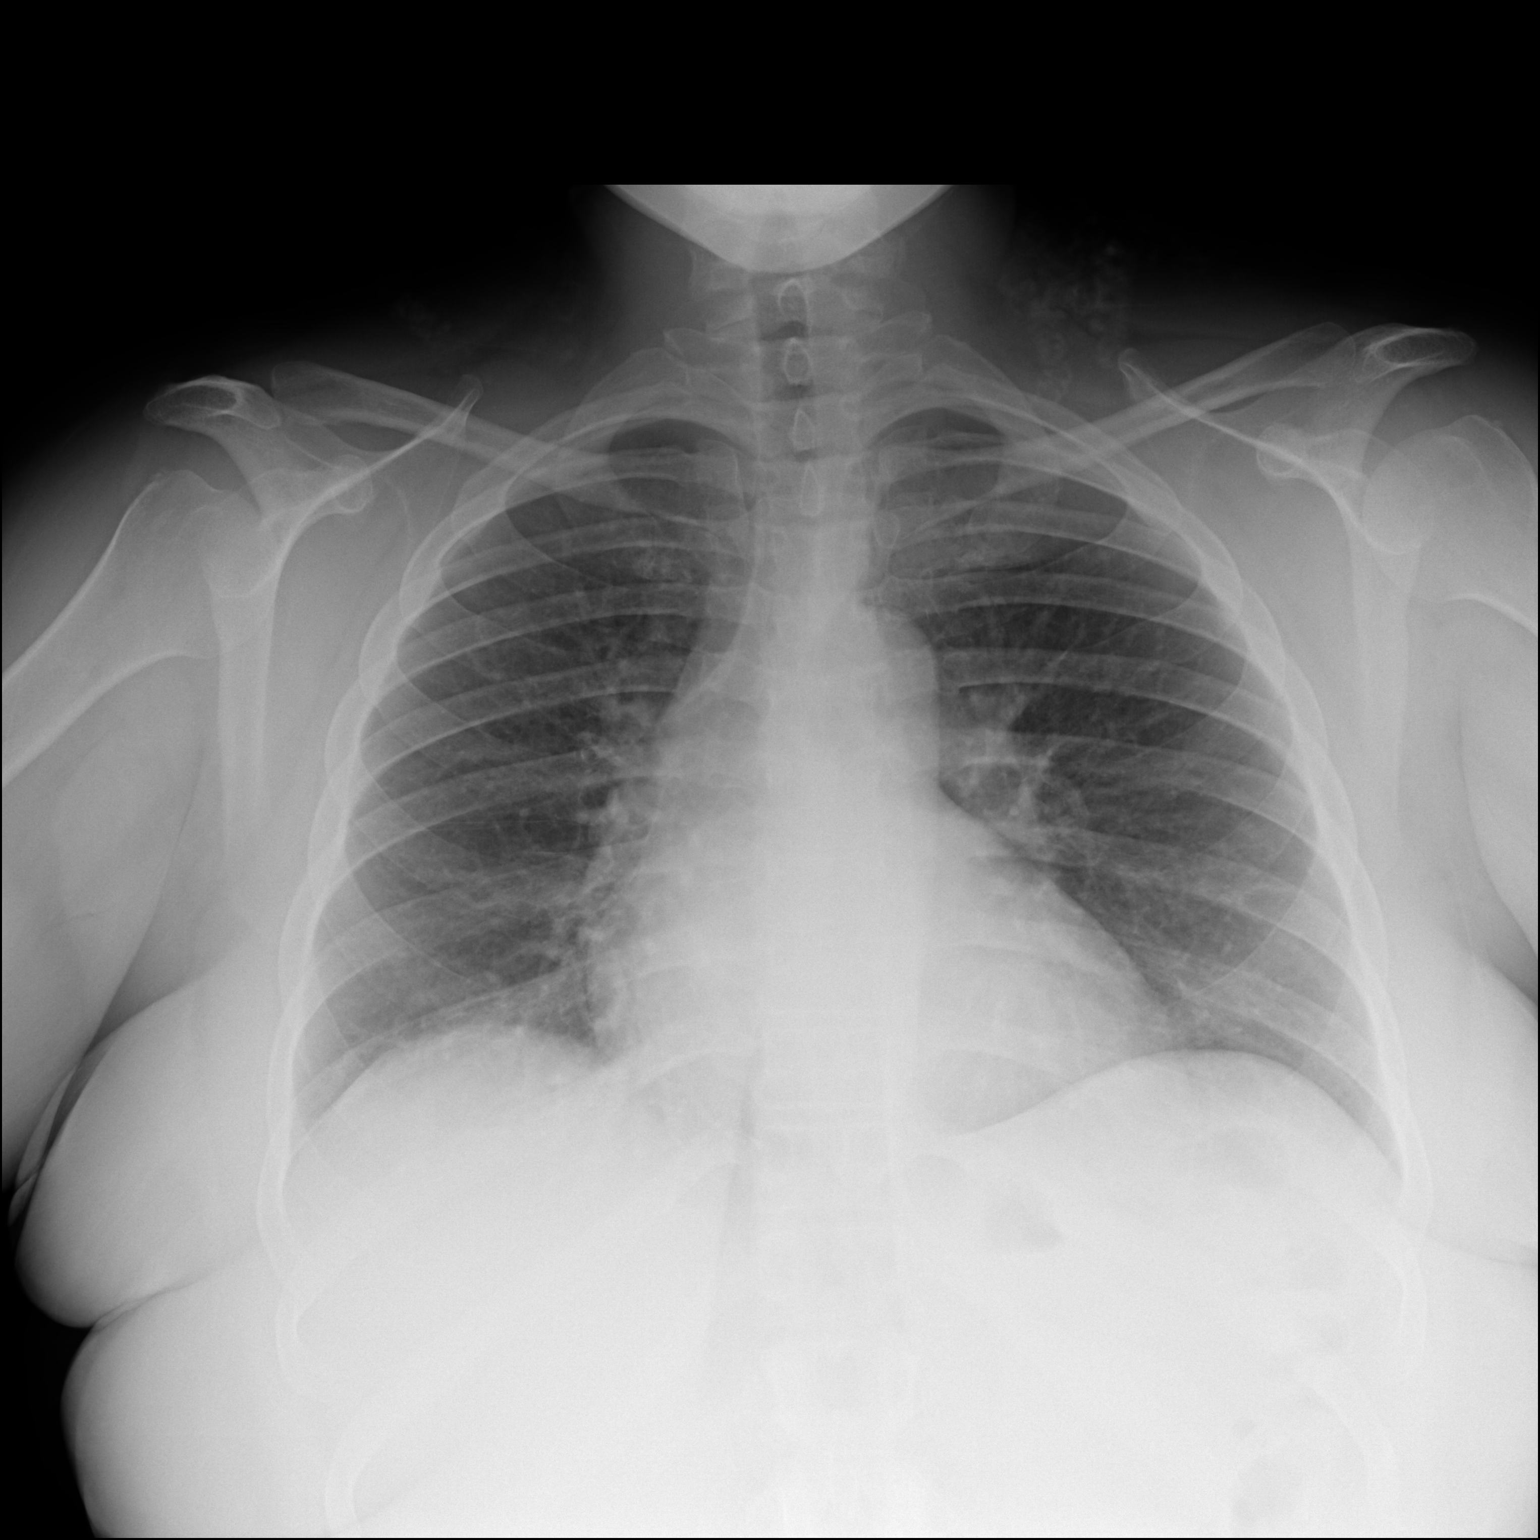

[dg chest 2 view (2 of 2)]
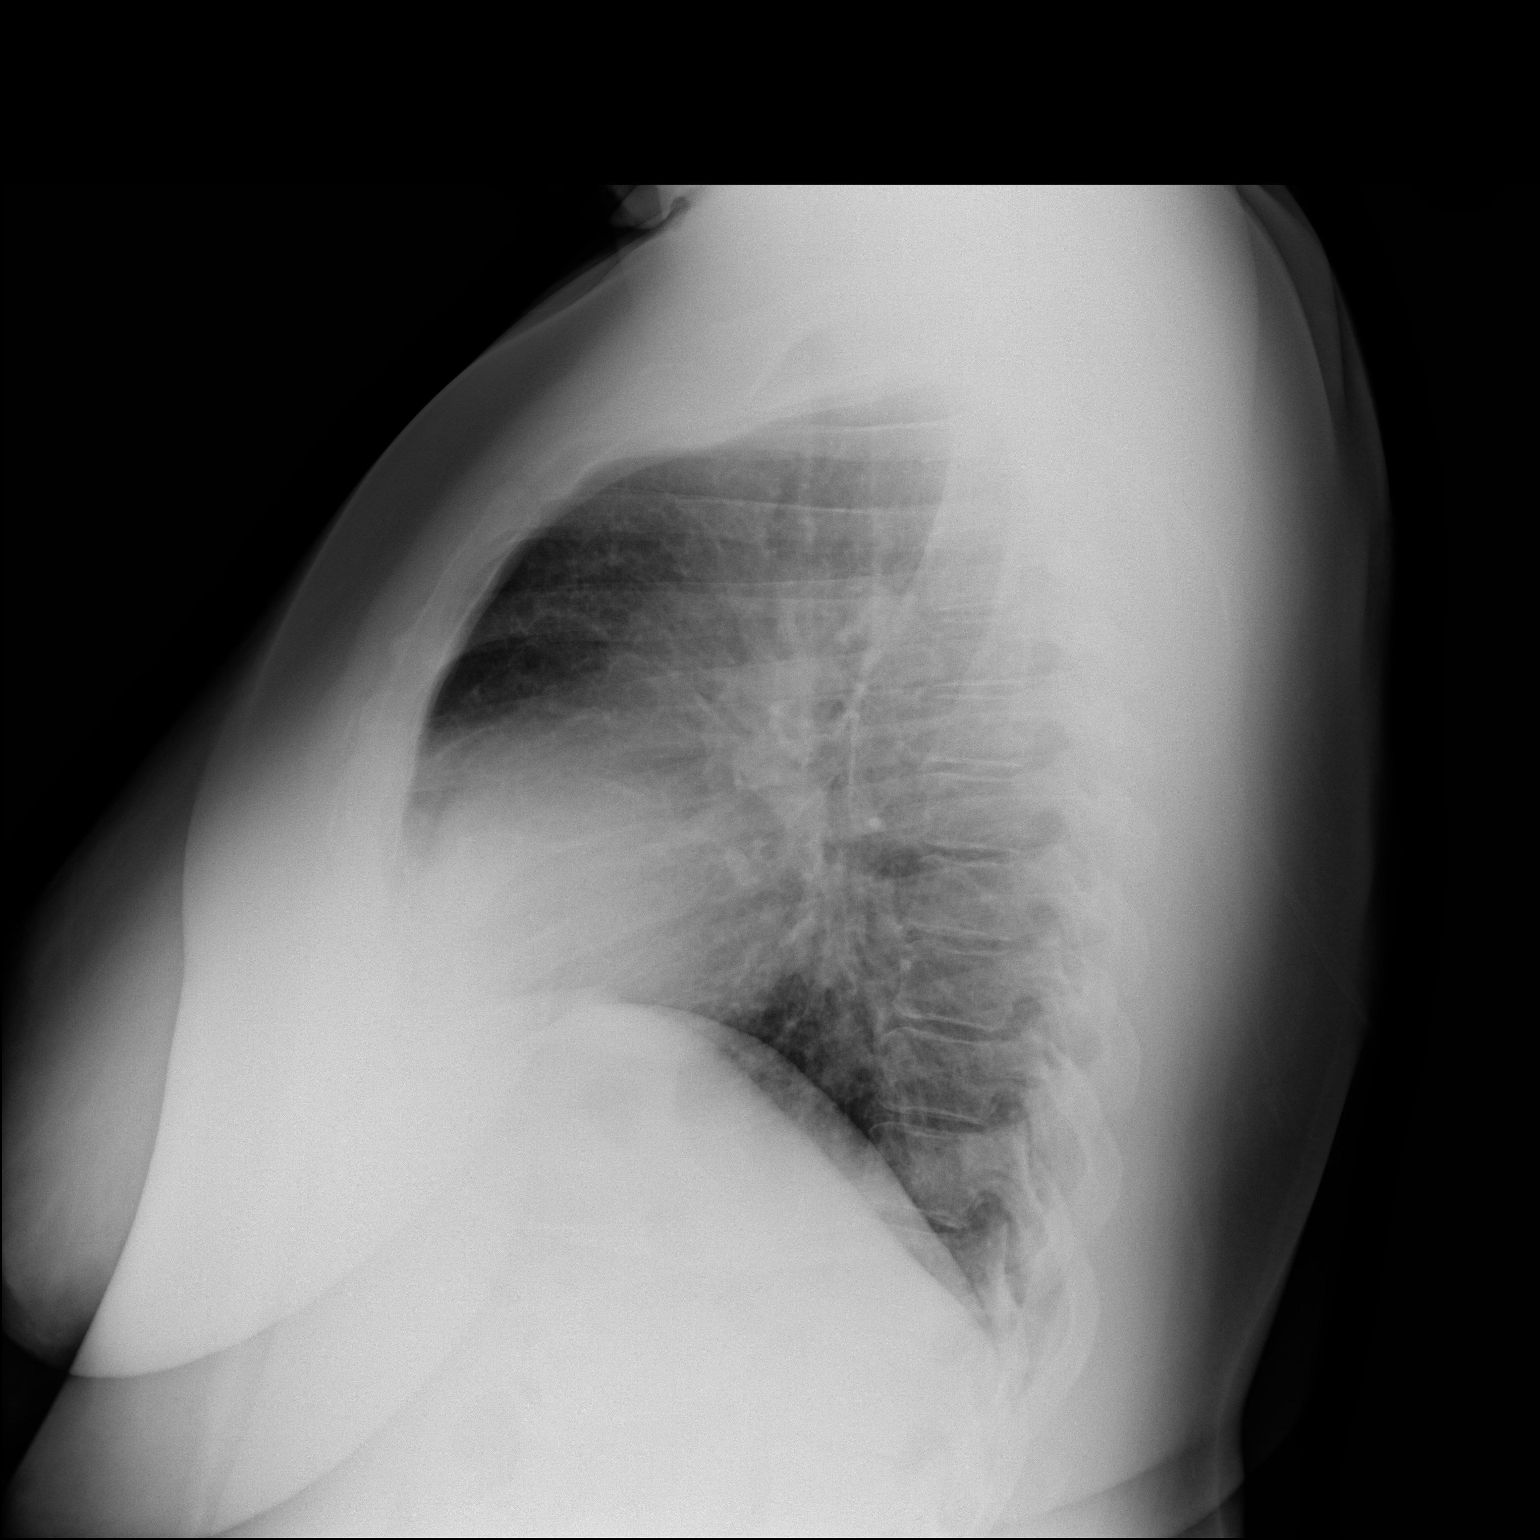

[2 of 2 positions shown; findings below may reference images not displayed]

FINDINGS: Heart size within normal limits. No appreciable residual airspace
disease. Previously demonstrated pleural effusions have resolved. No
evidence of pneumothorax. No acute bony abnormality identified.
IMPRESSION: No appreciable residual airspace disease.

Previously demonstrated pleural effusions have resolved.

## 2021-08-28 ENCOUNTER — Other Ambulatory Visit (HOSPITAL_BASED_OUTPATIENT_CLINIC_OR_DEPARTMENT_OTHER): Payer: Self-pay

## 2021-10-25 DIAGNOSIS — N939 Abnormal uterine and vaginal bleeding, unspecified: Secondary | ICD-10-CM | POA: Diagnosis not present

## 2021-11-21 DIAGNOSIS — F419 Anxiety disorder, unspecified: Secondary | ICD-10-CM | POA: Diagnosis not present

## 2021-11-21 DIAGNOSIS — R350 Frequency of micturition: Secondary | ICD-10-CM | POA: Diagnosis not present

## 2021-11-21 DIAGNOSIS — E039 Hypothyroidism, unspecified: Secondary | ICD-10-CM | POA: Diagnosis not present

## 2021-11-21 DIAGNOSIS — I10 Essential (primary) hypertension: Secondary | ICD-10-CM | POA: Diagnosis not present

## 2021-11-21 DIAGNOSIS — R739 Hyperglycemia, unspecified: Secondary | ICD-10-CM | POA: Diagnosis not present

## 2021-12-01 DIAGNOSIS — E1169 Type 2 diabetes mellitus with other specified complication: Secondary | ICD-10-CM | POA: Diagnosis not present

## 2021-12-01 DIAGNOSIS — U071 COVID-19: Secondary | ICD-10-CM | POA: Diagnosis not present

## 2021-12-01 DIAGNOSIS — I10 Essential (primary) hypertension: Secondary | ICD-10-CM | POA: Diagnosis not present

## 2022-01-24 DIAGNOSIS — I10 Essential (primary) hypertension: Secondary | ICD-10-CM | POA: Diagnosis not present

## 2022-01-24 DIAGNOSIS — E1169 Type 2 diabetes mellitus with other specified complication: Secondary | ICD-10-CM | POA: Diagnosis not present

## 2022-02-21 DIAGNOSIS — I10 Essential (primary) hypertension: Secondary | ICD-10-CM | POA: Diagnosis not present

## 2022-02-21 DIAGNOSIS — E1169 Type 2 diabetes mellitus with other specified complication: Secondary | ICD-10-CM | POA: Diagnosis not present

## 2022-02-21 DIAGNOSIS — Z8632 Personal history of gestational diabetes: Secondary | ICD-10-CM | POA: Diagnosis not present

## 2022-02-21 DIAGNOSIS — Z6841 Body Mass Index (BMI) 40.0 and over, adult: Secondary | ICD-10-CM | POA: Diagnosis not present

## 2022-03-30 DIAGNOSIS — J039 Acute tonsillitis, unspecified: Secondary | ICD-10-CM | POA: Diagnosis not present

## 2022-03-30 DIAGNOSIS — J069 Acute upper respiratory infection, unspecified: Secondary | ICD-10-CM | POA: Diagnosis not present

## 2022-05-24 DIAGNOSIS — E1169 Type 2 diabetes mellitus with other specified complication: Secondary | ICD-10-CM | POA: Diagnosis not present

## 2022-05-24 DIAGNOSIS — Z01419 Encounter for gynecological examination (general) (routine) without abnormal findings: Secondary | ICD-10-CM | POA: Diagnosis not present

## 2022-05-24 DIAGNOSIS — E039 Hypothyroidism, unspecified: Secondary | ICD-10-CM | POA: Diagnosis not present

## 2022-05-24 DIAGNOSIS — Z113 Encounter for screening for infections with a predominantly sexual mode of transmission: Secondary | ICD-10-CM | POA: Diagnosis not present

## 2022-05-24 DIAGNOSIS — Z3046 Encounter for surveillance of implantable subdermal contraceptive: Secondary | ICD-10-CM | POA: Diagnosis not present

## 2022-05-24 DIAGNOSIS — N898 Other specified noninflammatory disorders of vagina: Secondary | ICD-10-CM | POA: Diagnosis not present

## 2022-05-24 DIAGNOSIS — I10 Essential (primary) hypertension: Secondary | ICD-10-CM | POA: Diagnosis not present

## 2022-10-12 DIAGNOSIS — Z23 Encounter for immunization: Secondary | ICD-10-CM | POA: Diagnosis not present

## 2022-11-23 DIAGNOSIS — Z8249 Family history of ischemic heart disease and other diseases of the circulatory system: Secondary | ICD-10-CM | POA: Diagnosis not present

## 2022-11-23 DIAGNOSIS — Z6841 Body Mass Index (BMI) 40.0 and over, adult: Secondary | ICD-10-CM | POA: Diagnosis not present

## 2022-11-23 DIAGNOSIS — Z0000 Encounter for general adult medical examination without abnormal findings: Secondary | ICD-10-CM | POA: Diagnosis not present

## 2022-11-23 DIAGNOSIS — E039 Hypothyroidism, unspecified: Secondary | ICD-10-CM | POA: Diagnosis not present

## 2022-11-23 DIAGNOSIS — I10 Essential (primary) hypertension: Secondary | ICD-10-CM | POA: Diagnosis not present

## 2022-11-23 DIAGNOSIS — E1169 Type 2 diabetes mellitus with other specified complication: Secondary | ICD-10-CM | POA: Diagnosis not present

## 2022-11-23 DIAGNOSIS — Z13220 Encounter for screening for lipoid disorders: Secondary | ICD-10-CM | POA: Diagnosis not present

## 2022-12-04 ENCOUNTER — Ambulatory Visit
Admission: EM | Admit: 2022-12-04 | Discharge: 2022-12-04 | Disposition: A | Discharge status: Home / Self Care | Payer: BC Managed Care – PPO | Attending: Urgent Care | Admitting: Urgent Care

## 2022-12-04 DIAGNOSIS — Z79624 Long term (current) use of inhibitors of nucleotide synthesis: Secondary | ICD-10-CM | POA: Diagnosis not present

## 2022-12-04 DIAGNOSIS — Z23 Encounter for immunization: Secondary | ICD-10-CM | POA: Insufficient documentation

## 2022-12-04 DIAGNOSIS — Z9189 Other specified personal risk factors, not elsewhere classified: Secondary | ICD-10-CM

## 2022-12-04 DIAGNOSIS — Z79899 Other long term (current) drug therapy: Secondary | ICD-10-CM | POA: Diagnosis not present

## 2022-12-04 DIAGNOSIS — Z793 Long term (current) use of hormonal contraceptives: Secondary | ICD-10-CM | POA: Diagnosis not present

## 2022-12-04 DIAGNOSIS — Z7984 Long term (current) use of oral hypoglycemic drugs: Secondary | ICD-10-CM | POA: Insufficient documentation

## 2022-12-04 DIAGNOSIS — B349 Viral infection, unspecified: Secondary | ICD-10-CM

## 2022-12-04 DIAGNOSIS — Z79890 Hormone replacement therapy (postmenopausal): Secondary | ICD-10-CM | POA: Diagnosis not present

## 2022-12-04 DIAGNOSIS — Z1152 Encounter for screening for COVID-19: Secondary | ICD-10-CM | POA: Insufficient documentation

## 2022-12-04 LAB — RESP PANEL BY RT-PCR (FLU A&B, COVID) ARPGX2
Influenza A by PCR: POSITIVE — AB
Influenza B by PCR: NEGATIVE
SARS Coronavirus 2 by RT PCR: NEGATIVE

## 2022-12-04 MED ORDER — OSELTAMIVIR PHOSPHATE 75 MG PO CAPS: 75.0000 mg | ORAL_CAPSULE | Freq: Two times a day (BID) | ORAL | 0 refills | Status: AC

## 2022-12-04 NOTE — ED Triage Notes (Signed)
Pt c/o cough, head/chest congestion, body aches x 3 days-pt with +flu exposure-NAD-steady gait

## 2022-12-04 NOTE — ED Provider Notes (Signed)
[name] URG[facility]ARE [facility]  Note:  This document was prepared using Systems analyst and may include unintentional dictation errors.  MRN: [id number] DOB: [birth year]  Subjective:   Kathryn Buck is a 36 y.o. female presenting for 3-day history of acute onset persistent sinus congestion, chest congestion, coughing, body pains.  Patient had exposure to influenza from multiple people in close proximity.  No overt chest pain, shortness of breath or wheezing.  No history of asthma.  Patient is not a smoker.  No current facility-administered medications for this encounter.  Current Outpatient Medications:    lisinopril (ZESTRIL) 10 MG tablet, Take 10 mg by mouth daily., Disp: , Rfl:    acetaminophen (TYLENOL) 325 MG tablet, Take 2 tablets (650 mg total) by mouth every 4 (four) hours as needed for mild pain (temperature > 101.5.)., Disp: 30 tablet, Rfl: 0   cetirizine (ZYRTEC) 10 MG tablet, Take 10 mg by mouth daily as needed for allergies., Disp: , Rfl:    COVID-19 mRNA Vac-TriS, Pfizer, (PFIZER-BIONT COVID-19 VAC-TRIS) SUSP injection, Inject into the muscle., Disp: 0.3 mL, Rfl: 0   COVID-19 mRNA vaccine, Moderna, (MODERNA COVID-19 VACCINE) 100 MCG/0.5ML injection, Inject into the muscle., Disp: 0.5 mL, Rfl: 0   enoxaparin (LOVENOX) 40 MG/0.4ML injection, Inject 40 mg into the skin daily., Disp: , Rfl:    furosemide (LASIX) 20 MG tablet, Take 1 tablet (20 mg total) by mouth daily for 5 days., Disp: 5 tablet, Rfl: 0   iron polysaccharides (NIFEREX) 150 MG capsule, Take 1 capsule (150 mg total) by mouth daily., Disp: 30 capsule, Rfl: 1   labetalol (NORMODYNE) 200 MG tablet, Take 200 mg by mouth 2 (two) times daily., Disp: , Rfl:    levothyroxine (LEVOTHROID) 25 MCG tablet, Take 1 tablet (25 mcg total) by mouth daily before breakfast., Disp: 90 tablet, Rfl: 4   medroxyPROGESTERone (DEPO-PROVERA) 150 MG/ML injection, INJECT 1 ML INTO THE MUSCLE EVERY 90 DAYS, Disp: 1 mL, Rfl:  0   metFORMIN (GLUCOPHAGE) 500 MG tablet, Take 500 mg by mouth 2 (two) times daily with a meal., Disp: , Rfl:    oxyCODONE (OXY IR/ROXICODONE) 5 MG immediate release tablet, Take 1 tablet (5 mg total) by mouth every 4 (four) hours as needed for moderate pain., Disp: 20 tablet, Rfl: 0   potassium chloride (KLOR-CON) 10 MEQ tablet, Take 2 tablets (20 mEq total) by mouth daily for 5 days., Disp: 10 tablet, Rfl: 0   Prenatal Vit-Fe Fumarate-FA (PRENATAL MULTIVITAMIN) TABS tablet, Take 1 tablet by mouth daily at 12 noon., Disp: , Rfl:    senna-docusate (SENOKOT-S) 8.6-50 MG tablet, Take 2 tablets by mouth daily., Disp: 30 tablet, Rfl: 0   valACYclovir (VALTREX) 500 MG tablet, Take 500 mg by mouth 2 (two) times daily., Disp: , Rfl:    No Known Allergies  Past Medical History:  Diagnosis Date   Diabetes mellitus without complication ([place])    Type 2   DVT (deep venous thrombosis) ([facility])    Gestational diabetes    metformin   History of chlamydia    History of gonorrhea    Hypertension    labetalol    Hypothyroidism    synthroid   STD (sexually transmitted disease)    Vaginal Pap smear, abnormal      Past Surgical History:  Procedure Laterality Date   CESAREAN SECTION N/A 12/15/2020   Procedure: CESAREAN SECTION;  Surgeon: Drema Dallas, DO;  Location: [facility];  Service: Obstetrics;  Laterality:  N/A;   EXPLORATORY LAPAROTOMY     left ovarian cystectomy dermoid 2014   LAPAROTOMY Left 08/10/2013   Procedure: EXPLORATORY LAPAROTOMY; LEFT OVARIAN CYSTECTOMY;  Surgeon: Terrance Mass, MD;  Location: [facility];  Service: Gynecology;  Laterality: Left;   OVARIAN CYST SURGERY     WISDOM TOOTH EXTRACTION      Family History  Problem Relation Age of Onset   Diabetes Mother    Heart disease Mother    Hypertension Mother    Hypertension Father    Diabetes Sister     Social History   Tobacco Use   Smoking status: Never   Smokeless tobacco: Never  Vaping Use   Vaping Use: Never used   Substance Use Topics   Alcohol use: Yes    Alcohol/week: 0.0 standard drinks of alcohol    Comment: occasional   Drug use: No    ROS   Objective:   Vitals: BP (!) 134/95 (BP Location: Left Arm)   Pulse (!) 106   Temp 99.3 F (37.4 C) (Oral)   Resp 17   SpO2 95%   Physical Exam Constitutional:      General: She is not in acute distress.    Appearance: Normal appearance. She is well-developed and normal weight. She is not ill-appearing, toxic-appearing or diaphoretic.  HENT:     Head: Normocephalic and atraumatic.     Right Ear: Tympanic membrane, ear canal and external ear normal. No drainage or tenderness. No middle ear effusion. There is no impacted cerumen. Tympanic membrane is not erythematous or bulging.     Left Ear: Tympanic membrane, ear canal and external ear normal. No drainage or tenderness.  No middle ear effusion. There is no impacted cerumen. Tympanic membrane is not erythematous or bulging.     Nose: Nose normal. No congestion or rhinorrhea.     Mouth/Throat:     Mouth: Mucous membranes are moist. No oral lesions.     Pharynx: No pharyngeal swelling, oropharyngeal exudate, posterior oropharyngeal erythema or uvula swelling.     Tonsils: No tonsillar exudate or tonsillar abscesses.  Eyes:     General: No scleral icterus.       Right eye: No discharge.        Left eye: No discharge.     Extraocular Movements: Extraocular movements intact.     Right eye: Normal extraocular motion.     Left eye: Normal extraocular motion.     Conjunctiva/sclera: Conjunctivae normal.  Cardiovascular:     Rate and Rhythm: Normal rate and regular rhythm.     Heart sounds: Normal heart sounds. No murmur heard.    No friction rub. No gallop.  Pulmonary:     Effort: Pulmonary effort is normal. No respiratory distress.     Breath sounds: No stridor. No wheezing, rhonchi or rales.  Chest:     Chest wall: No tenderness.  Musculoskeletal:     Cervical back: Normal range of motion  and neck supple.  Lymphadenopathy:     Cervical: No cervical adenopathy.  Skin:    General: Skin is warm and dry.  Neurological:     General: No focal deficit present.     Mental Status: She is alert and oriented to person, place, and time.  Psychiatric:        Mood and Affect: Mood normal.        Behavior: Behavior normal.     Assessment and Plan :   PDMP not reviewed this encounter.  1.  Acute viral syndrome   2. At increased risk for exposure to influenza virus     Patient requested COVID and flu swab.  Also provided her with a prescription for Tamiflu for empiric treatment. Deferred imaging given clear cardiopulmonary exam, hemodynamically stable vital signs.   If she test positive for COVID-19, patient would be a good candidate to undergo COVID-19 treatment with Paxlovid.  No history of CKD, deferred repeat blood work for this.  Counseled patient on potential for adverse effects with medications prescribed/recommended today, ER and return-to-clinic precautions discussed, patient verbalized understanding.    Jaynee Eagles, [place] 12/04/22 1530

## 2022-12-04 NOTE — Discharge Instructions (Addendum)

## 2023-05-30 DIAGNOSIS — E1169 Type 2 diabetes mellitus with other specified complication: Secondary | ICD-10-CM | POA: Diagnosis not present

## 2023-05-30 DIAGNOSIS — Z01419 Encounter for gynecological examination (general) (routine) without abnormal findings: Secondary | ICD-10-CM | POA: Diagnosis not present

## 2023-05-30 DIAGNOSIS — I10 Essential (primary) hypertension: Secondary | ICD-10-CM | POA: Diagnosis not present

## 2023-05-30 DIAGNOSIS — Z7251 High risk heterosexual behavior: Secondary | ICD-10-CM | POA: Diagnosis not present

## 2023-05-30 DIAGNOSIS — E039 Hypothyroidism, unspecified: Secondary | ICD-10-CM | POA: Diagnosis not present

## 2023-09-01 ENCOUNTER — Ambulatory Visit
Admission: EM | Admit: 2023-09-01 | Discharge: 2023-09-01 | Disposition: A | Discharge status: Home / Self Care | Payer: BC Managed Care – PPO | Attending: Internal Medicine | Admitting: Internal Medicine

## 2023-09-01 DIAGNOSIS — J309 Allergic rhinitis, unspecified: Secondary | ICD-10-CM | POA: Diagnosis not present

## 2023-09-01 DIAGNOSIS — J029 Acute pharyngitis, unspecified: Secondary | ICD-10-CM

## 2023-09-01 LAB — POCT RAPID STREP A (OFFICE): Rapid Strep A Screen: NEGATIVE

## 2023-09-01 MED ORDER — AMOXICILLIN 875 MG PO TABS: 875.0000 mg | ORAL_TABLET | Freq: Two times a day (BID) | ORAL | 0 refills | Status: AC

## 2023-09-01 MED ORDER — PSEUDOEPHEDRINE HCL 60 MG PO TABS: 60.0000 mg | ORAL_TABLET | Freq: Three times a day (TID) | ORAL | 0 refills | Status: AC | PRN

## 2023-09-01 MED ORDER — FLUCONAZOLE 150 MG PO TABS: 150.0000 mg | ORAL_TABLET | ORAL | 0 refills | Status: AC

## 2023-09-01 NOTE — Discharge Instructions (Signed)
We will manage this as a pharyngitis infection, throat infection with amoxicillin. For sore throat or cough try using a honey-based tea. Use 3 teaspoons of honey with juice squeezed from half lemon. Place shaved pieces of ginger into 1/2-1 cup of water and warm over stove top. Then mix the ingredients and repeat every 4 hours as needed. Please take ibuprofen 600mg  every 6 hours with food alternating with OR taken together with Tylenol 650mg  every 6 hours for throat pain, fevers, aches and pains. Hydrate very well with at least 2 liters of water. Eat light meals such as soups (chicken and noodles, vegetable, chicken and wild rice).  Do not eat foods that you are allergic to.  Keep taking Zyrtec since it can help against postnasal drainage, sinus congestion which can cause sinus pain, sinus headaches, throat pain, painful swallowing, coughing.  You can take this together with pseudoephedrine (Sudafed) at a dose of 60 mg 3 times a day or twice daily as needed for the same kind of nasal drip, congestion.

## 2023-09-01 NOTE — ED Triage Notes (Signed)
Pt presents with c/o a sore throat x 2 weeks. States it gets better and then worse. Denies fever and sneezing, occasional cough.

## 2023-09-01 NOTE — ED Provider Notes (Signed)
[name]  Note:  This document was prepared using Conservation officer, historic buildings and may include unintentional dictation errors.  MRN: [id number] DOB: 29-Jan-1986  Subjective:   Kathryn Buck is a 37 y.o. female presenting for 2-week history of persistent and worsening throat pain, painful swallowing.  No fever, runny or stuffy nose.  Patient has an occasional cough.  Has longstanding history of intermittent ear popping and fullness.  She does take Zyrtec every day for her allergies.  She can get yeast infections with antibiotics.  Last course was more than a year ago for an urinary tract infection.  No current facility-administered medications for this encounter.  Current Outpatient Medications:    acetaminophen (TYLENOL) 325 MG tablet, Take 2 tablets (650 mg total) by mouth every 4 (four) hours as needed for mild pain (temperature > 101.5.)., Disp: 30 tablet, Rfl: 0   cetirizine (ZYRTEC) 10 MG tablet, Take 10 mg by mouth daily as needed for allergies., Disp: , Rfl:    COVID-19 mRNA Vac-TriS, Pfizer, (PFIZER-BIONT COVID-19 VAC-TRIS) SUSP injection, Inject into the muscle., Disp: 0.3 mL, Rfl: 0   COVID-19 mRNA vaccine, Moderna, (MODERNA COVID-19 VACCINE) 100 MCG/0.5ML injection, Inject into the muscle., Disp: 0.5 mL, Rfl: 0   enoxaparin (LOVENOX) 40 MG/0.4ML injection, Inject 40 mg into the skin daily., Disp: , Rfl:    furosemide (LASIX) 20 MG tablet, Take 1 tablet (20 mg total) by mouth daily for 5 days., Disp: 5 tablet, Rfl: 0   iron polysaccharides (NIFEREX) 150 MG capsule, Take 1 capsule (150 mg total) by mouth daily., Disp: 30 capsule, Rfl: 1   labetalol (NORMODYNE) 200 MG tablet, Take 200 mg by mouth 2 (two) times daily., Disp: , Rfl:    levothyroxine (LEVOTHROID) 25 MCG tablet, Take 1 tablet (25 mcg total) by mouth daily before breakfast., Disp: 90 tablet, Rfl: 4   lisinopril (ZESTRIL) 10 MG tablet, Take 10 mg by mouth daily., Disp: , Rfl:     medroxyPROGESTERone (DEPO-PROVERA) 150 MG/ML injection, INJECT 1 ML INTO THE MUSCLE EVERY 90 DAYS, Disp: 1 mL, Rfl: 0   metFORMIN (GLUCOPHAGE) 500 MG tablet, Take 500 mg by mouth 2 (two) times daily with a meal., Disp: , Rfl:    oseltamivir (TAMIFLU) 75 MG capsule, Take 1 capsule (75 mg total) by mouth 2 (two) times daily., Disp: 10 capsule, Rfl: 0   oxyCODONE (OXY IR/ROXICODONE) 5 MG immediate release tablet, Take 1 tablet (5 mg total) by mouth every 4 (four) hours as needed for moderate pain., Disp: 20 tablet, Rfl: 0   potassium chloride (KLOR-CON) 10 MEQ tablet, Take 2 tablets (20 mEq total) by mouth daily for 5 days., Disp: 10 tablet, Rfl: 0   Prenatal Vit-Fe Fumarate-FA (PRENATAL MULTIVITAMIN) TABS tablet, Take 1 tablet by mouth daily at 12 noon., Disp: , Rfl:    senna-docusate (SENOKOT-S) 8.6-50 MG tablet, Take 2 tablets by mouth daily., Disp: 30 tablet, Rfl: 0   valACYclovir (VALTREX) 500 MG tablet, Take 500 mg by mouth 2 (two) times daily., Disp: , Rfl:    No Known Allergies  Past Medical History:  Diagnosis Date   Diabetes mellitus without complication (HCC)    Type 2   DVT (deep venous thrombosis) (HCC)    Gestational diabetes    metformin   History of chlamydia    History of gonorrhea    Hypertension    labetalol    Hypothyroidism    synthroid   STD (sexually transmitted disease)  Vaginal Pap smear, abnormal      Past Surgical History:  Procedure Laterality Date   CESAREAN SECTION N/A 12/15/2020   Procedure: CESAREAN SECTION;  Surgeon: Steva Ready, DO;  Location: [facility];  Service: Obstetrics;  Laterality: N/A;   EXPLORATORY LAPAROTOMY     left ovarian cystectomy dermoid 2014   LAPAROTOMY Left 08/10/2013   Procedure: EXPLORATORY LAPAROTOMY; LEFT OVARIAN CYSTECTOMY;  Surgeon: Ok Edwards, MD;  Location: [facility];  Service: Gynecology;  Laterality: Left;   OVARIAN CYST SURGERY     WISDOM TOOTH EXTRACTION      Family History  Problem Relation Age of Onset    Diabetes Mother    Heart disease Mother    Hypertension Mother    Hypertension Father    Diabetes Sister     Social History   Tobacco Use   Smoking status: Never   Smokeless tobacco: Never  Vaping Use   Vaping status: Never Used  Substance Use Topics   Alcohol use: Yes    Alcohol/week: 0.0 standard drinks of alcohol    Comment: occasional   Drug use: No    ROS   Objective:   Vitals: BP 128/85 (BP Location: Right Arm)   Pulse 83   Temp 98.9 F (37.2 C) (Oral)   Resp 18   LMP  (LMP Unknown)   SpO2 95%   Breastfeeding No   Physical Exam Constitutional:      General: She is not in acute distress.    Appearance: Normal appearance. She is well-developed. She is not ill-appearing, toxic-appearing or diaphoretic.  HENT:     Head: Normocephalic and atraumatic.     Nose: Nose normal.     Mouth/Throat:     Mouth: Mucous membranes are moist.     Pharynx: Pharyngeal swelling present. No oropharyngeal exudate, posterior oropharyngeal erythema or uvula swelling.     Tonsils: No tonsillar exudate or tonsillar abscesses. 0 on the right. 0 on the left.     Comments: Chronic tonsillar hypertrophy on either side with diffuse crypts and crevices that have debris.  Pharynx with thick coat of postnasal drainage. Eyes:     General: No scleral icterus.       Right eye: No discharge.        Left eye: No discharge.     Extraocular Movements: Extraocular movements intact.  Cardiovascular:     Rate and Rhythm: Normal rate.  Pulmonary:     Effort: Pulmonary effort is normal.  Skin:    General: Skin is warm and dry.  Neurological:     General: No focal deficit present.     Mental Status: She is alert and oriented to person, place, and time.  Psychiatric:        Mood and Affect: Mood normal.        Behavior: Behavior normal.    Results for orders placed or performed during the hospital encounter of 09/01/23 (from the past 24 hour(s))  POCT rapid strep A     Status: None    Collection Time: 09/01/23  8:28 AM  Result Value Ref Range   Rapid Strep A Screen Negative Negative    Assessment and Plan :   PDMP not reviewed this encounter.  1. Acute pharyngitis, unspecified etiology   2. Allergic rhinitis, unspecified seasonality, unspecified trigger    Recommended a course of amoxicillin for her pharyngitis which is likely secondary to the postnasal drainage, chronic tonsillar hypertrophy, allergic rhinitis.  Recommend supportive care otherwise.  Counseled patient on potential for adverse effects with medications prescribed/recommended today, ER and return-to-clinic precautions discussed, patient verbalized understanding.    Wallis Bamberg, [place] 09/01/23 [phone]

## 2023-10-14 DIAGNOSIS — Z23 Encounter for immunization: Secondary | ICD-10-CM | POA: Diagnosis not present

## 2023-12-09 DIAGNOSIS — I10 Essential (primary) hypertension: Secondary | ICD-10-CM | POA: Diagnosis not present

## 2023-12-09 DIAGNOSIS — Z0000 Encounter for general adult medical examination without abnormal findings: Secondary | ICD-10-CM | POA: Diagnosis not present

## 2023-12-09 DIAGNOSIS — E1169 Type 2 diabetes mellitus with other specified complication: Secondary | ICD-10-CM | POA: Diagnosis not present

## 2023-12-09 DIAGNOSIS — E039 Hypothyroidism, unspecified: Secondary | ICD-10-CM | POA: Diagnosis not present

## 2024-01-13 DIAGNOSIS — E119 Type 2 diabetes mellitus without complications: Secondary | ICD-10-CM | POA: Diagnosis not present
